# Patient Record
Sex: Female | Born: 1965 | Race: Black or African American | Hispanic: No | Marital: Married | Smoking: Never smoker
Health system: Southern US, Community
[De-identification: ages and names within clinical notes are randomized; demographics above are authoritative.]

## PROBLEM LIST (undated history)

## (undated) DIAGNOSIS — K219 Gastro-esophageal reflux disease without esophagitis: Secondary | ICD-10-CM

## (undated) DIAGNOSIS — IMO0001 Reserved for inherently not codable concepts without codable children: Secondary | ICD-10-CM

## (undated) DIAGNOSIS — D563 Thalassemia minor: Secondary | ICD-10-CM

## (undated) DIAGNOSIS — M549 Dorsalgia, unspecified: Secondary | ICD-10-CM

## (undated) DIAGNOSIS — D219 Benign neoplasm of connective and other soft tissue, unspecified: Secondary | ICD-10-CM

## (undated) DIAGNOSIS — G8929 Other chronic pain: Secondary | ICD-10-CM

## (undated) HISTORY — PX: BACK SURGERY: SHX140

## (undated) HISTORY — PX: EYE SURGERY: SHX253

## (undated) HISTORY — PX: NECK SURGERY: SHX720

## (undated) HISTORY — DX: Benign neoplasm of connective and other soft tissue, unspecified: D21.9

## (undated) HISTORY — PX: BRAIN SURGERY: SHX531

## (undated) HISTORY — PX: TUBAL LIGATION: SHX77

---

## 1998-12-31 ENCOUNTER — Other Ambulatory Visit: Admission: RE | Admit: 1998-12-31 | Discharge: 1998-12-31 | Payer: Self-pay | Admitting: Obstetrics and Gynecology

## 1999-02-11 ENCOUNTER — Encounter
Admission: RE | Admit: 1999-02-11 | Discharge: 1999-02-11 | Payer: Self-pay | Admitting: Physical Medicine & Rehabilitation

## 1999-02-11 ENCOUNTER — Encounter: Payer: Self-pay | Admitting: Physical Medicine & Rehabilitation

## 1999-03-03 ENCOUNTER — Encounter
Admission: RE | Admit: 1999-03-03 | Discharge: 1999-03-03 | Payer: Self-pay | Admitting: Physical Medicine & Rehabilitation

## 1999-03-03 ENCOUNTER — Encounter: Payer: Self-pay | Admitting: Physical Medicine & Rehabilitation

## 1999-05-10 ENCOUNTER — Ambulatory Visit (HOSPITAL_COMMUNITY)
Admission: RE | Admit: 1999-05-10 | Discharge: 1999-05-10 | Payer: Self-pay | Admitting: Physical Medicine & Rehabilitation

## 1999-05-10 ENCOUNTER — Encounter: Payer: Self-pay | Admitting: Physical Medicine & Rehabilitation

## 1999-08-11 ENCOUNTER — Ambulatory Visit (HOSPITAL_COMMUNITY)
Admission: RE | Admit: 1999-08-11 | Discharge: 1999-08-11 | Payer: Self-pay | Admitting: Physical Medicine & Rehabilitation

## 1999-08-11 ENCOUNTER — Encounter: Payer: Self-pay | Admitting: Physical Medicine & Rehabilitation

## 2000-01-26 ENCOUNTER — Other Ambulatory Visit: Admission: RE | Admit: 2000-01-26 | Discharge: 2000-01-26 | Payer: Self-pay | Admitting: *Deleted

## 2000-03-17 ENCOUNTER — Ambulatory Visit (HOSPITAL_COMMUNITY): Admission: RE | Admit: 2000-03-17 | Discharge: 2000-03-17 | Payer: Self-pay | Admitting: *Deleted

## 2000-03-17 ENCOUNTER — Encounter: Payer: Self-pay | Admitting: *Deleted

## 2000-03-26 ENCOUNTER — Inpatient Hospital Stay (HOSPITAL_COMMUNITY): Admission: AD | Admit: 2000-03-26 | Discharge: 2000-03-26 | Payer: Self-pay | Admitting: Gynecology

## 2000-05-05 ENCOUNTER — Inpatient Hospital Stay (HOSPITAL_COMMUNITY): Admission: AD | Admit: 2000-05-05 | Discharge: 2000-05-07 | Payer: Self-pay | Admitting: *Deleted

## 2000-05-07 ENCOUNTER — Encounter: Payer: Self-pay | Admitting: *Deleted

## 2000-06-02 ENCOUNTER — Encounter: Admission: RE | Admit: 2000-06-02 | Discharge: 2000-08-31 | Payer: Self-pay | Admitting: *Deleted

## 2000-06-30 ENCOUNTER — Encounter (HOSPITAL_COMMUNITY): Admission: RE | Admit: 2000-06-30 | Discharge: 2000-07-24 | Payer: Self-pay | Admitting: *Deleted

## 2000-07-23 ENCOUNTER — Encounter (INDEPENDENT_AMBULATORY_CARE_PROVIDER_SITE_OTHER): Payer: Self-pay | Admitting: Specialist

## 2000-07-23 ENCOUNTER — Inpatient Hospital Stay (HOSPITAL_COMMUNITY): Admission: AD | Admit: 2000-07-23 | Discharge: 2000-07-26 | Payer: Self-pay | Admitting: Gynecology

## 2000-10-06 ENCOUNTER — Other Ambulatory Visit: Admission: RE | Admit: 2000-10-06 | Discharge: 2000-10-06 | Payer: Self-pay | Admitting: *Deleted

## 2001-01-06 ENCOUNTER — Emergency Department (HOSPITAL_COMMUNITY): Admission: EM | Admit: 2001-01-06 | Discharge: 2001-01-06 | Payer: Self-pay | Admitting: Emergency Medicine

## 2001-07-04 ENCOUNTER — Encounter: Payer: Self-pay | Admitting: Physical Medicine & Rehabilitation

## 2001-07-04 ENCOUNTER — Encounter
Admission: RE | Admit: 2001-07-04 | Discharge: 2001-07-04 | Payer: Self-pay | Admitting: Physical Medicine & Rehabilitation

## 2001-07-09 ENCOUNTER — Encounter
Admission: RE | Admit: 2001-07-09 | Discharge: 2001-07-09 | Payer: Self-pay | Admitting: Physical Medicine & Rehabilitation

## 2001-07-09 ENCOUNTER — Encounter: Payer: Self-pay | Admitting: Physical Medicine & Rehabilitation

## 2001-07-20 ENCOUNTER — Encounter: Payer: Self-pay | Admitting: Physical Medicine & Rehabilitation

## 2001-07-20 ENCOUNTER — Encounter
Admission: RE | Admit: 2001-07-20 | Discharge: 2001-07-20 | Payer: Self-pay | Admitting: Physical Medicine & Rehabilitation

## 2001-08-08 ENCOUNTER — Encounter
Admission: RE | Admit: 2001-08-08 | Discharge: 2001-08-08 | Payer: Self-pay | Admitting: Physical Medicine & Rehabilitation

## 2001-08-08 ENCOUNTER — Encounter: Payer: Self-pay | Admitting: Physical Medicine & Rehabilitation

## 2001-09-05 ENCOUNTER — Encounter: Payer: Self-pay | Admitting: Physical Medicine & Rehabilitation

## 2001-09-05 ENCOUNTER — Encounter
Admission: RE | Admit: 2001-09-05 | Discharge: 2001-09-05 | Payer: Self-pay | Admitting: Physical Medicine & Rehabilitation

## 2001-09-09 ENCOUNTER — Emergency Department (HOSPITAL_COMMUNITY): Admission: EM | Admit: 2001-09-09 | Discharge: 2001-09-09 | Payer: Self-pay | Admitting: Emergency Medicine

## 2001-09-10 ENCOUNTER — Encounter: Payer: Self-pay | Admitting: Physical Medicine & Rehabilitation

## 2001-09-10 ENCOUNTER — Encounter
Admission: RE | Admit: 2001-09-10 | Discharge: 2001-09-10 | Payer: Self-pay | Admitting: Physical Medicine & Rehabilitation

## 2001-11-19 ENCOUNTER — Other Ambulatory Visit: Admission: RE | Admit: 2001-11-19 | Discharge: 2001-11-19 | Payer: Self-pay | Admitting: *Deleted

## 2002-03-19 ENCOUNTER — Encounter: Admission: RE | Admit: 2002-03-19 | Discharge: 2002-03-19 | Payer: Self-pay | Admitting: *Deleted

## 2002-05-08 ENCOUNTER — Inpatient Hospital Stay (HOSPITAL_COMMUNITY): Admission: AD | Admit: 2002-05-08 | Discharge: 2002-05-08 | Payer: Self-pay | Admitting: Gynecology

## 2002-05-24 ENCOUNTER — Inpatient Hospital Stay (HOSPITAL_COMMUNITY): Admission: AD | Admit: 2002-05-24 | Discharge: 2002-05-24 | Payer: Self-pay | Admitting: Gynecology

## 2002-05-27 ENCOUNTER — Encounter (INDEPENDENT_AMBULATORY_CARE_PROVIDER_SITE_OTHER): Payer: Self-pay | Admitting: Specialist

## 2002-05-27 ENCOUNTER — Inpatient Hospital Stay (HOSPITAL_COMMUNITY): Admission: AD | Admit: 2002-05-27 | Discharge: 2002-05-30 | Payer: Self-pay | Admitting: *Deleted

## 2002-08-01 ENCOUNTER — Encounter: Payer: Self-pay | Admitting: Emergency Medicine

## 2002-08-01 ENCOUNTER — Emergency Department (HOSPITAL_COMMUNITY): Admission: EM | Admit: 2002-08-01 | Discharge: 2002-08-01 | Payer: Self-pay | Admitting: Emergency Medicine

## 2002-09-25 ENCOUNTER — Encounter
Admission: RE | Admit: 2002-09-25 | Discharge: 2002-12-24 | Payer: Self-pay | Admitting: Physical Medicine & Rehabilitation

## 2003-01-22 ENCOUNTER — Encounter
Admission: RE | Admit: 2003-01-22 | Discharge: 2003-04-22 | Payer: Self-pay | Admitting: Physical Medicine & Rehabilitation

## 2003-04-21 ENCOUNTER — Encounter
Admission: RE | Admit: 2003-04-21 | Discharge: 2003-07-20 | Payer: Self-pay | Admitting: Physical Medicine & Rehabilitation

## 2003-07-21 ENCOUNTER — Encounter
Admission: RE | Admit: 2003-07-21 | Discharge: 2003-10-19 | Payer: Self-pay | Admitting: Physical Medicine & Rehabilitation

## 2003-10-21 ENCOUNTER — Encounter
Admission: RE | Admit: 2003-10-21 | Discharge: 2004-01-01 | Payer: Self-pay | Admitting: Physical Medicine & Rehabilitation

## 2004-01-01 ENCOUNTER — Encounter
Admission: RE | Admit: 2004-01-01 | Discharge: 2004-03-31 | Payer: Self-pay | Admitting: Physical Medicine & Rehabilitation

## 2004-01-02 ENCOUNTER — Ambulatory Visit: Payer: Self-pay | Admitting: Physical Medicine & Rehabilitation

## 2004-03-19 ENCOUNTER — Ambulatory Visit: Payer: Self-pay | Admitting: Physical Medicine & Rehabilitation

## 2004-04-27 ENCOUNTER — Encounter
Admission: RE | Admit: 2004-04-27 | Discharge: 2004-07-26 | Payer: Self-pay | Admitting: Physical Medicine & Rehabilitation

## 2004-06-24 ENCOUNTER — Ambulatory Visit: Payer: Self-pay | Admitting: Physical Medicine & Rehabilitation

## 2004-07-15 ENCOUNTER — Emergency Department (HOSPITAL_COMMUNITY): Admission: EM | Admit: 2004-07-15 | Discharge: 2004-07-15 | Payer: Self-pay | Admitting: Emergency Medicine

## 2004-08-10 ENCOUNTER — Other Ambulatory Visit: Admission: RE | Admit: 2004-08-10 | Discharge: 2004-08-10 | Payer: Self-pay | Admitting: Gynecology

## 2004-08-24 ENCOUNTER — Encounter
Admission: RE | Admit: 2004-08-24 | Discharge: 2004-11-22 | Payer: Self-pay | Admitting: Physical Medicine & Rehabilitation

## 2004-10-07 ENCOUNTER — Ambulatory Visit: Payer: Self-pay | Admitting: Physical Medicine & Rehabilitation

## 2004-12-09 ENCOUNTER — Encounter
Admission: RE | Admit: 2004-12-09 | Discharge: 2005-03-09 | Payer: Self-pay | Admitting: Physical Medicine & Rehabilitation

## 2004-12-09 ENCOUNTER — Ambulatory Visit: Payer: Self-pay | Admitting: Physical Medicine & Rehabilitation

## 2005-02-09 ENCOUNTER — Ambulatory Visit: Payer: Self-pay | Admitting: Physical Medicine & Rehabilitation

## 2005-04-13 ENCOUNTER — Ambulatory Visit: Payer: Self-pay | Admitting: Physical Medicine & Rehabilitation

## 2005-04-13 ENCOUNTER — Encounter
Admission: RE | Admit: 2005-04-13 | Discharge: 2005-07-12 | Payer: Self-pay | Admitting: Physical Medicine & Rehabilitation

## 2005-07-12 ENCOUNTER — Ambulatory Visit: Payer: Self-pay | Admitting: Physical Medicine & Rehabilitation

## 2005-07-12 ENCOUNTER — Encounter
Admission: RE | Admit: 2005-07-12 | Discharge: 2005-10-10 | Payer: Self-pay | Admitting: Physical Medicine & Rehabilitation

## 2005-10-10 ENCOUNTER — Encounter
Admission: RE | Admit: 2005-10-10 | Discharge: 2006-01-08 | Payer: Self-pay | Admitting: Physical Medicine & Rehabilitation

## 2005-10-10 ENCOUNTER — Ambulatory Visit: Payer: Self-pay | Admitting: Physical Medicine & Rehabilitation

## 2006-01-04 ENCOUNTER — Ambulatory Visit: Payer: Self-pay | Admitting: Physical Medicine & Rehabilitation

## 2006-02-23 ENCOUNTER — Encounter
Admission: RE | Admit: 2006-02-23 | Discharge: 2006-05-24 | Payer: Self-pay | Admitting: Physical Medicine & Rehabilitation

## 2006-02-23 ENCOUNTER — Ambulatory Visit: Payer: Self-pay | Admitting: Physical Medicine & Rehabilitation

## 2006-04-20 ENCOUNTER — Encounter
Admission: RE | Admit: 2006-04-20 | Discharge: 2006-07-19 | Payer: Self-pay | Admitting: Physical Medicine & Rehabilitation

## 2006-04-20 ENCOUNTER — Ambulatory Visit: Payer: Self-pay | Admitting: Physical Medicine & Rehabilitation

## 2006-06-19 ENCOUNTER — Ambulatory Visit: Payer: Self-pay | Admitting: Family Medicine

## 2006-06-20 ENCOUNTER — Ambulatory Visit: Payer: Self-pay | Admitting: Physical Medicine & Rehabilitation

## 2006-06-21 ENCOUNTER — Encounter: Admission: RE | Admit: 2006-06-21 | Discharge: 2006-09-19 | Payer: Self-pay | Admitting: Family Medicine

## 2006-08-14 ENCOUNTER — Encounter
Admission: RE | Admit: 2006-08-14 | Discharge: 2006-11-12 | Payer: Self-pay | Admitting: Physical Medicine & Rehabilitation

## 2006-08-16 ENCOUNTER — Ambulatory Visit: Payer: Self-pay | Admitting: Physical Medicine & Rehabilitation

## 2006-09-13 ENCOUNTER — Ambulatory Visit: Payer: Self-pay | Admitting: Family Medicine

## 2006-09-13 DIAGNOSIS — F329 Major depressive disorder, single episode, unspecified: Secondary | ICD-10-CM

## 2006-09-13 DIAGNOSIS — D509 Iron deficiency anemia, unspecified: Secondary | ICD-10-CM

## 2006-09-13 DIAGNOSIS — F3289 Other specified depressive episodes: Secondary | ICD-10-CM | POA: Insufficient documentation

## 2006-09-13 DIAGNOSIS — F411 Generalized anxiety disorder: Secondary | ICD-10-CM | POA: Insufficient documentation

## 2006-09-15 LAB — CONVERTED CEMR LAB
Basophils Relative: 0.5 % (ref 0.0–1.0)
Eosinophils Relative: 3.8 % (ref 0.0–5.0)
Hemoglobin: 11.7 g/dL — ABNORMAL LOW (ref 12.0–15.0)
Lymphocytes Relative: 34.1 % (ref 12.0–46.0)
Monocytes Relative: 12.8 % — ABNORMAL HIGH (ref 3.0–11.0)
TSH: 1.46 microintl units/mL (ref 0.35–5.50)
WBC: 6.4 10*3/uL (ref 4.5–10.5)

## 2006-09-26 ENCOUNTER — Telehealth (INDEPENDENT_AMBULATORY_CARE_PROVIDER_SITE_OTHER): Payer: Self-pay | Admitting: *Deleted

## 2006-09-27 ENCOUNTER — Ambulatory Visit: Payer: Self-pay | Admitting: Family Medicine

## 2006-09-27 DIAGNOSIS — R142 Eructation: Secondary | ICD-10-CM

## 2006-09-27 DIAGNOSIS — R141 Gas pain: Secondary | ICD-10-CM

## 2006-09-27 DIAGNOSIS — R143 Flatulence: Secondary | ICD-10-CM

## 2006-09-27 LAB — CONVERTED CEMR LAB: Beta hcg, urine, semiquantitative: NEGATIVE

## 2006-11-01 ENCOUNTER — Encounter (INDEPENDENT_AMBULATORY_CARE_PROVIDER_SITE_OTHER): Payer: Self-pay | Admitting: Family Medicine

## 2006-11-13 ENCOUNTER — Encounter
Admission: RE | Admit: 2006-11-13 | Discharge: 2007-01-09 | Payer: Self-pay | Admitting: Physical Medicine & Rehabilitation

## 2006-11-13 ENCOUNTER — Ambulatory Visit: Payer: Self-pay | Admitting: Physical Medicine & Rehabilitation

## 2006-12-07 ENCOUNTER — Ambulatory Visit: Payer: Self-pay | Admitting: Family Medicine

## 2006-12-07 DIAGNOSIS — H547 Unspecified visual loss: Secondary | ICD-10-CM

## 2006-12-13 ENCOUNTER — Ambulatory Visit: Payer: Self-pay | Admitting: Family Medicine

## 2006-12-13 LAB — CONVERTED CEMR LAB
Eosinophils Absolute: 0.2 10*3/uL (ref 0.0–0.6)
Eosinophils Relative: 3.5 % (ref 0.0–5.0)
HCT: 33.4 % — ABNORMAL LOW (ref 36.0–46.0)
Lymphocytes Relative: 35.2 % (ref 12.0–46.0)
MCV: 80.3 fL (ref 78.0–100.0)
Neutro Abs: 2.6 10*3/uL (ref 1.4–7.7)
Neutrophils Relative %: 47 % (ref 43.0–77.0)
WBC: 5.6 10*3/uL (ref 4.5–10.5)

## 2006-12-14 ENCOUNTER — Telehealth (INDEPENDENT_AMBULATORY_CARE_PROVIDER_SITE_OTHER): Payer: Self-pay | Admitting: *Deleted

## 2006-12-14 LAB — CONVERTED CEMR LAB

## 2006-12-20 ENCOUNTER — Ambulatory Visit: Payer: Self-pay | Admitting: Family Medicine

## 2006-12-21 ENCOUNTER — Encounter (INDEPENDENT_AMBULATORY_CARE_PROVIDER_SITE_OTHER): Payer: Self-pay | Admitting: *Deleted

## 2007-02-06 ENCOUNTER — Ambulatory Visit: Payer: Self-pay | Admitting: Physical Medicine & Rehabilitation

## 2007-02-07 ENCOUNTER — Encounter
Admission: RE | Admit: 2007-02-07 | Discharge: 2007-05-08 | Payer: Self-pay | Admitting: Physical Medicine & Rehabilitation

## 2007-02-23 ENCOUNTER — Encounter (INDEPENDENT_AMBULATORY_CARE_PROVIDER_SITE_OTHER): Payer: Self-pay | Admitting: Family Medicine

## 2007-03-01 ENCOUNTER — Ambulatory Visit: Payer: Self-pay | Admitting: Family Medicine

## 2007-03-01 ENCOUNTER — Telehealth (INDEPENDENT_AMBULATORY_CARE_PROVIDER_SITE_OTHER): Payer: Self-pay | Admitting: *Deleted

## 2007-03-22 ENCOUNTER — Ambulatory Visit: Payer: Self-pay | Admitting: Physical Medicine & Rehabilitation

## 2007-03-25 ENCOUNTER — Encounter (INDEPENDENT_AMBULATORY_CARE_PROVIDER_SITE_OTHER): Payer: Self-pay | Admitting: Family Medicine

## 2007-03-25 ENCOUNTER — Encounter
Admission: RE | Admit: 2007-03-25 | Discharge: 2007-03-25 | Payer: Self-pay | Admitting: Physical Medicine & Rehabilitation

## 2007-03-26 ENCOUNTER — Telehealth (INDEPENDENT_AMBULATORY_CARE_PROVIDER_SITE_OTHER): Payer: Self-pay | Admitting: *Deleted

## 2007-03-28 ENCOUNTER — Ambulatory Visit: Payer: Self-pay | Admitting: Family Medicine

## 2007-03-28 DIAGNOSIS — S83006A Unspecified dislocation of unspecified patella, initial encounter: Secondary | ICD-10-CM | POA: Insufficient documentation

## 2007-03-29 ENCOUNTER — Encounter (INDEPENDENT_AMBULATORY_CARE_PROVIDER_SITE_OTHER): Payer: Self-pay | Admitting: Family Medicine

## 2007-05-03 ENCOUNTER — Telehealth (INDEPENDENT_AMBULATORY_CARE_PROVIDER_SITE_OTHER): Payer: Self-pay | Admitting: Family Medicine

## 2007-05-09 ENCOUNTER — Telehealth (INDEPENDENT_AMBULATORY_CARE_PROVIDER_SITE_OTHER): Payer: Self-pay | Admitting: *Deleted

## 2007-05-10 ENCOUNTER — Encounter
Admission: RE | Admit: 2007-05-10 | Discharge: 2007-05-11 | Payer: Self-pay | Admitting: Physical Medicine & Rehabilitation

## 2007-05-10 ENCOUNTER — Ambulatory Visit: Payer: Self-pay | Admitting: Physical Medicine & Rehabilitation

## 2007-05-11 ENCOUNTER — Encounter (INDEPENDENT_AMBULATORY_CARE_PROVIDER_SITE_OTHER): Payer: Self-pay | Admitting: *Deleted

## 2007-05-11 ENCOUNTER — Ambulatory Visit: Payer: Self-pay | Admitting: Family Medicine

## 2007-05-21 ENCOUNTER — Telehealth (INDEPENDENT_AMBULATORY_CARE_PROVIDER_SITE_OTHER): Payer: Self-pay | Admitting: *Deleted

## 2007-05-21 ENCOUNTER — Encounter (INDEPENDENT_AMBULATORY_CARE_PROVIDER_SITE_OTHER): Payer: Self-pay | Admitting: Family Medicine

## 2007-06-06 ENCOUNTER — Telehealth (INDEPENDENT_AMBULATORY_CARE_PROVIDER_SITE_OTHER): Payer: Self-pay | Admitting: *Deleted

## 2007-07-30 ENCOUNTER — Encounter
Admission: RE | Admit: 2007-07-30 | Discharge: 2007-07-30 | Payer: Self-pay | Admitting: Physical Medicine & Rehabilitation

## 2007-07-30 ENCOUNTER — Ambulatory Visit: Payer: Self-pay | Admitting: Physical Medicine & Rehabilitation

## 2007-08-22 ENCOUNTER — Ambulatory Visit: Payer: Self-pay | Admitting: Internal Medicine

## 2007-08-22 ENCOUNTER — Encounter (INDEPENDENT_AMBULATORY_CARE_PROVIDER_SITE_OTHER): Payer: Self-pay | Admitting: *Deleted

## 2007-08-22 DIAGNOSIS — G4710 Hypersomnia, unspecified: Secondary | ICD-10-CM

## 2007-08-22 DIAGNOSIS — R635 Abnormal weight gain: Secondary | ICD-10-CM | POA: Insufficient documentation

## 2007-08-22 DIAGNOSIS — M940 Chondrocostal junction syndrome [Tietze]: Secondary | ICD-10-CM | POA: Insufficient documentation

## 2007-08-22 LAB — CONVERTED CEMR LAB: Free T4: 0.8 ng/dL (ref 0.6–1.6)

## 2007-08-28 ENCOUNTER — Encounter: Payer: Self-pay | Admitting: Internal Medicine

## 2007-08-30 ENCOUNTER — Encounter (INDEPENDENT_AMBULATORY_CARE_PROVIDER_SITE_OTHER): Payer: Self-pay | Admitting: *Deleted

## 2007-09-11 ENCOUNTER — Telehealth (INDEPENDENT_AMBULATORY_CARE_PROVIDER_SITE_OTHER): Payer: Self-pay | Admitting: *Deleted

## 2007-09-11 ENCOUNTER — Emergency Department (HOSPITAL_COMMUNITY): Admission: EM | Admit: 2007-09-11 | Discharge: 2007-09-11 | Payer: Self-pay | Admitting: Family Medicine

## 2007-09-12 ENCOUNTER — Telehealth (INDEPENDENT_AMBULATORY_CARE_PROVIDER_SITE_OTHER): Payer: Self-pay | Admitting: *Deleted

## 2007-09-27 ENCOUNTER — Ambulatory Visit: Payer: Self-pay | Admitting: Internal Medicine

## 2007-09-27 DIAGNOSIS — G43909 Migraine, unspecified, not intractable, without status migrainosus: Secondary | ICD-10-CM | POA: Insufficient documentation

## 2007-10-11 ENCOUNTER — Emergency Department (HOSPITAL_COMMUNITY): Admission: EM | Admit: 2007-10-11 | Discharge: 2007-10-11 | Payer: Self-pay | Admitting: Family Medicine

## 2007-10-17 ENCOUNTER — Emergency Department (HOSPITAL_BASED_OUTPATIENT_CLINIC_OR_DEPARTMENT_OTHER): Admission: EM | Admit: 2007-10-17 | Discharge: 2007-10-18 | Payer: Self-pay | Admitting: Emergency Medicine

## 2007-10-17 ENCOUNTER — Other Ambulatory Visit: Admission: RE | Admit: 2007-10-17 | Discharge: 2007-10-17 | Payer: Self-pay | Admitting: Obstetrics and Gynecology

## 2007-10-17 ENCOUNTER — Telehealth: Payer: Self-pay | Admitting: Internal Medicine

## 2007-10-24 ENCOUNTER — Encounter (INDEPENDENT_AMBULATORY_CARE_PROVIDER_SITE_OTHER): Payer: Self-pay | Admitting: *Deleted

## 2007-10-31 ENCOUNTER — Ambulatory Visit: Payer: Self-pay | Admitting: Hematology & Oncology

## 2007-11-02 ENCOUNTER — Encounter: Payer: Self-pay | Admitting: Internal Medicine

## 2007-11-02 ENCOUNTER — Telehealth: Payer: Self-pay | Admitting: Internal Medicine

## 2007-11-08 ENCOUNTER — Encounter
Admission: RE | Admit: 2007-11-08 | Discharge: 2007-11-09 | Payer: Self-pay | Admitting: Physical Medicine & Rehabilitation

## 2007-11-09 ENCOUNTER — Ambulatory Visit: Payer: Self-pay | Admitting: Physical Medicine & Rehabilitation

## 2008-01-11 ENCOUNTER — Telehealth (INDEPENDENT_AMBULATORY_CARE_PROVIDER_SITE_OTHER): Payer: Self-pay | Admitting: *Deleted

## 2008-01-31 ENCOUNTER — Encounter
Admission: RE | Admit: 2008-01-31 | Discharge: 2008-02-01 | Payer: Self-pay | Admitting: Physical Medicine & Rehabilitation

## 2008-02-01 ENCOUNTER — Ambulatory Visit: Payer: Self-pay | Admitting: Physical Medicine & Rehabilitation

## 2008-04-25 ENCOUNTER — Encounter
Admission: RE | Admit: 2008-04-25 | Discharge: 2008-04-25 | Payer: Self-pay | Admitting: Physical Medicine & Rehabilitation

## 2008-05-22 ENCOUNTER — Ambulatory Visit: Payer: Self-pay | Admitting: Diagnostic Radiology

## 2008-05-22 ENCOUNTER — Emergency Department (HOSPITAL_BASED_OUTPATIENT_CLINIC_OR_DEPARTMENT_OTHER): Admission: EM | Admit: 2008-05-22 | Discharge: 2008-05-22 | Payer: Self-pay | Admitting: Emergency Medicine

## 2008-06-24 ENCOUNTER — Encounter
Admission: RE | Admit: 2008-06-24 | Discharge: 2008-06-27 | Payer: Self-pay | Admitting: Physical Medicine & Rehabilitation

## 2008-06-27 ENCOUNTER — Ambulatory Visit: Payer: Self-pay | Admitting: Physical Medicine & Rehabilitation

## 2009-06-04 ENCOUNTER — Ambulatory Visit: Payer: Self-pay | Admitting: Radiology

## 2009-06-04 ENCOUNTER — Emergency Department (HOSPITAL_BASED_OUTPATIENT_CLINIC_OR_DEPARTMENT_OTHER): Admission: EM | Admit: 2009-06-04 | Discharge: 2009-06-04 | Payer: Self-pay | Admitting: Emergency Medicine

## 2009-06-11 ENCOUNTER — Emergency Department (HOSPITAL_BASED_OUTPATIENT_CLINIC_OR_DEPARTMENT_OTHER): Admission: EM | Admit: 2009-06-11 | Discharge: 2009-06-11 | Payer: Self-pay | Admitting: Emergency Medicine

## 2010-02-10 ENCOUNTER — Other Ambulatory Visit: Admission: RE | Admit: 2010-02-10 | Discharge: 2010-02-10 | Payer: Self-pay | Admitting: Obstetrics and Gynecology

## 2010-02-10 ENCOUNTER — Ambulatory Visit: Payer: Self-pay | Admitting: Obstetrics and Gynecology

## 2010-02-17 ENCOUNTER — Emergency Department (HOSPITAL_BASED_OUTPATIENT_CLINIC_OR_DEPARTMENT_OTHER): Admission: EM | Admit: 2010-02-17 | Discharge: 2010-02-18 | Payer: Self-pay | Admitting: Emergency Medicine

## 2010-02-17 ENCOUNTER — Ambulatory Visit: Payer: Self-pay | Admitting: Interventional Radiology

## 2010-02-19 ENCOUNTER — Ambulatory Visit: Payer: Self-pay | Admitting: Diagnostic Radiology

## 2010-02-19 ENCOUNTER — Emergency Department (HOSPITAL_BASED_OUTPATIENT_CLINIC_OR_DEPARTMENT_OTHER): Admission: EM | Admit: 2010-02-19 | Discharge: 2010-02-19 | Payer: Self-pay | Admitting: Emergency Medicine

## 2010-03-08 ENCOUNTER — Ambulatory Visit: Payer: Self-pay | Admitting: Obstetrics and Gynecology

## 2010-05-02 ENCOUNTER — Encounter: Payer: Self-pay | Admitting: Family Medicine

## 2010-06-22 LAB — BASIC METABOLIC PANEL
BUN: 7 mg/dL (ref 6–23)
Calcium: 8.8 mg/dL (ref 8.4–10.5)
Creatinine, Ser: 1 mg/dL (ref 0.4–1.2)
GFR calc Af Amer: 59 mL/min — ABNORMAL LOW (ref >60)
GFR calc non Af Amer: 49 mL/min — ABNORMAL LOW (ref >60)
GFR calc non Af Amer: 60 mL/min — ABNORMAL LOW (ref >60)
Glucose, Bld: 104 mg/dL — ABNORMAL HIGH (ref 70–99)
Glucose, Bld: 97 mg/dL (ref 70–99)
Sodium: 138 mEq/L (ref 135–145)

## 2010-06-22 LAB — URINALYSIS, ROUTINE W REFLEX MICROSCOPIC
Bilirubin Urine: NEGATIVE
Hgb urine dipstick: NEGATIVE
Ketones, ur: NEGATIVE mg/dL
Nitrite: NEGATIVE
Nitrite: NEGATIVE
Protein, ur: NEGATIVE mg/dL
Specific Gravity, Urine: 1.005 (ref 1.005–1.030)
Urobilinogen, UA: 0.2 mg/dL (ref 0.0–1.0)
Urobilinogen, UA: 0.2 mg/dL (ref 0.0–1.0)

## 2010-06-22 LAB — CSF CULTURE W GRAM STAIN: Culture: NO GROWTH

## 2010-06-22 LAB — CBC
Hemoglobin: 10.4 g/dL — ABNORMAL LOW (ref 12.0–15.0)
MCHC: 32.7 g/dL (ref 30.0–36.0)
MCHC: 33.3 g/dL (ref 30.0–36.0)
Platelets: 249 10*3/uL (ref 150–400)
Platelets: 300 10*3/uL (ref 150–400)
RBC: 4.3 MIL/uL (ref 3.87–5.11)
RDW: 16.3 % — ABNORMAL HIGH (ref 11.5–15.5)
WBC: 4.1 10*3/uL (ref 4.0–10.5)

## 2010-06-22 LAB — DIFFERENTIAL
Basophils Absolute: 0.1 10*3/uL (ref 0.0–0.1)
Basophils Absolute: 0.1 10*3/uL (ref 0.0–0.1)
Basophils Relative: 1 % (ref 0–1)
Basophils Relative: 2 % — ABNORMAL HIGH (ref 0–1)
Eosinophils Relative: 8 % — ABNORMAL HIGH (ref 0–5)
Lymphocytes Relative: 16 % (ref 12–46)
Monocytes Absolute: 0.6 10*3/uL (ref 0.1–1.0)
Monocytes Relative: 17 % — ABNORMAL HIGH (ref 3–12)
Neutro Abs: 2.4 10*3/uL (ref 1.7–7.7)
Neutro Abs: 2.5 10*3/uL (ref 1.7–7.7)
Neutrophils Relative %: 52 % (ref 43–77)

## 2010-06-22 LAB — GRAM STAIN

## 2010-06-22 LAB — CSF CELL COUNT WITH DIFFERENTIAL: Tube #: 4

## 2010-06-22 LAB — PREGNANCY, URINE: Preg Test, Ur: NEGATIVE

## 2010-06-22 LAB — PROTEIN, CSF: Total  Protein, CSF: 15 mg/dL (ref 15–45)

## 2010-06-30 LAB — URINE MICROSCOPIC-ADD ON

## 2010-06-30 LAB — URINALYSIS, ROUTINE W REFLEX MICROSCOPIC
Ketones, ur: NEGATIVE mg/dL
Nitrite: NEGATIVE
Protein, ur: NEGATIVE mg/dL
Urobilinogen, UA: 0.2 mg/dL (ref 0.0–1.0)

## 2010-06-30 LAB — PREGNANCY, URINE: Preg Test, Ur: NEGATIVE

## 2010-06-30 LAB — URINE CULTURE: Colony Count: 60000

## 2010-06-30 LAB — CBC
MCHC: 33.4 g/dL (ref 30.0–36.0)
RBC: 4.19 MIL/uL (ref 3.87–5.11)
WBC: 8 10*3/uL (ref 4.0–10.5)

## 2010-06-30 LAB — WET PREP, GENITAL: Trich, Wet Prep: NONE SEEN

## 2010-06-30 LAB — DIFFERENTIAL
Eosinophils Relative: 4 % (ref 0–5)
Monocytes Absolute: 0.6 10*3/uL (ref 0.1–1.0)
Monocytes Relative: 8 % (ref 3–12)
Neutrophils Relative %: 58 % (ref 43–77)

## 2010-06-30 LAB — GC/CHLAMYDIA PROBE AMP, GENITAL: GC Probe Amp, Genital: NEGATIVE

## 2010-06-30 LAB — BASIC METABOLIC PANEL
Calcium: 8.6 mg/dL (ref 8.4–10.5)
Creatinine, Ser: 0.7 mg/dL (ref 0.4–1.2)
GFR calc Af Amer: >60 mL/min (ref >60)
GFR calc non Af Amer: >60 mL/min (ref >60)

## 2010-08-24 ENCOUNTER — Emergency Department (INDEPENDENT_AMBULATORY_CARE_PROVIDER_SITE_OTHER): Payer: Medicare Other

## 2010-08-24 ENCOUNTER — Emergency Department (HOSPITAL_BASED_OUTPATIENT_CLINIC_OR_DEPARTMENT_OTHER)
Admission: EM | Admit: 2010-08-24 | Discharge: 2010-08-24 | Disposition: A | Discharge status: Home / Self Care | Payer: Medicare Other | Attending: Emergency Medicine | Admitting: Emergency Medicine

## 2010-08-24 DIAGNOSIS — R509 Fever, unspecified: Secondary | ICD-10-CM

## 2010-08-24 DIAGNOSIS — R0989 Other specified symptoms and signs involving the circulatory and respiratory systems: Secondary | ICD-10-CM

## 2010-08-24 DIAGNOSIS — G8929 Other chronic pain: Secondary | ICD-10-CM | POA: Insufficient documentation

## 2010-08-24 DIAGNOSIS — R05 Cough: Secondary | ICD-10-CM

## 2010-08-24 DIAGNOSIS — Z79899 Other long term (current) drug therapy: Secondary | ICD-10-CM | POA: Insufficient documentation

## 2010-08-24 DIAGNOSIS — B9789 Other viral agents as the cause of diseases classified elsewhere: Secondary | ICD-10-CM | POA: Insufficient documentation

## 2010-08-24 NOTE — Assessment & Plan Note (Signed)
Caitlin Stanley returns to the clinic today accompanied by her minister and his  wife. The patient has had substantially increased pain over the past  several days. She returned to [place] to pick up her mother by car  and then drove all the way back essentially with her mother alone. She  has had increased pain in her low back which is very severe at the  present time. She has started increasing her OxyContin medication to 20  mg 2 tablets twice a day instead of 1 tablet twice a day. She has  increased her immediate release use up to 6 tablets per day and had them  2-4 tablets per day. She has cut back slightly on Valium as that has  made her too sleepy. She reports that she has had severe pain over the  past several days and wonders about a change in medications. We have  discussed that in the office today.   The patient reports that she has talked to her case manager through  Medicare and disability. Her Medicare advisor has lead her believe that  if she got a slip from myself regarding help into the home that that  could be provided.   MEDICATIONS:  1. OxyContin immediate release 5 mg 1-2 tablets p.o. b.i.d. p.r.n.      (recently 4-6 day).  2. Oxycodone sustained release 20 mg q.12 h (recently 40 mg b.i.d.).  3. Valium 10 mg t.i.d. p.r.n.  4. Prozac 20 mg daily.   REVIEW OF SYSTEMS:  Noncontributory.   PHYSICAL EXAMINATION:  Reasonably ill-appearing adult middle-age female  who is lying on the exam table in moderate to severe discomfort. Her  friends were with her and brought her in by car today. The patient has  trigger points identified in her trapezii, thoracic, lower lumbar and  bilateral SI joints. She does report severe pain even with minimal  manual muscle testing.   IMPRESSION:  1. Lumbar spondylosis with annular disk bulge without disk herniation.  2. 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  3. 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   In the office today, we did agree to increase her OxyContin sustained  release to 40 mg 1 tablet q.12 h. I have asked her to keep her immediate  release down to a maximum of 4 tablets a day and I refilled that in the  office today. I also refilled her Prozac. She does not need a refill on  her Valium in the office at this point. I have given them a slip for an  aide to be into the house to help with childcare and chores and  hopefully this can be obtained for her. I will anticipate that she would  have gradual improvement in her low back pain as probably a lot of the  pain that she is experiencing right now is secondary to spasms with  recent increased driving.   We did have the patient sign a consent for trigger point injections. I  have prepared a 10 mL syringe with 9 mL of 1% lidocaine and 1 mL of  Kenalog-40. I cleansed 2 areas in her trapezii, 2 areas in her mid  thoracic, 2 areas in her lumbar and 2 areas in her buttocks. Each area  on each side of the box was injected with approximately 1.5 mL of the  above-noted mixture and the patient tolerated the procedure well. Good  hemostasis was obtained at each injection site.  Will plan on seeing the patient in followup in approximately 2 months'  time with refills prior to that appointment as necessary.           ______________________________  Ellwood Dense, M.D.     DC/MedQ  D:  02/07/2007 11:32:46  T:  02/07/2007 16:26:49  Job #:  [id number]

## 2010-08-24 NOTE — Assessment & Plan Note (Signed)
Caitlin Stanley returns to clinic today for followup evaluation.  She reports  that she was at a political rally recently in high heels for an extended  period of time.  She has had increased severe spasms of her back since  that time.  The spasms are from her buttocks up through her cervical  spine.  She requests injections that she has had previously along with  an adjustment in her Valium.  She has been using the Valium twice a day,  but wants to try to use it 3 times a day to help with the spasms.  She  also needs refills on her Prozac and oxycodone medications.   REVIEW OF SYSTEMS:  Noncontributory.   MEDICATIONS:  1. Oxycodone immediate release 5 mg 1 to 2 tablets p.o. b.i.d. p.r.n.      (2 to 4 per day).  2. OxyContin sustained release 20 mg q. 12 hours.  3. Valium 10 mg b.i.d. p.r.n.  4. Prozac 20 mg daily.   PHYSICAL EXAMINATION:  Ill-appearing, middle aged adult female in  moderate to severe acute discomfort.  Blood pressure and vitals were not obtained.  She has trigger points identified in her bilateral thoracic region along  with buttocks bilaterally superiorly.   IMPRESSION:  1. Lumbar spondylosis with anular disk bulge without disk herniation.  2. 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  3. Chronic low back pain with mid thoracic/buttock/trapezii trigger      points.   In the office today, we did refill the patient's Valium, and increased  her up to 3 times a day as needed.  We also refilled the Prozac  medication.  The oxycodone sustained released and immediate release were  filled as of Aug 23, 2006.  She also requested hand control brakes on a  walker so that she can get around.  She is having some troubles with the  increased back pain.  I have asked her to try heat on the muscles to  help with the spasms in addition to the medication.  We also had the  patient timed for trigger point injections.  I prepared a 10-cc syringe  with 8 cc of 1% lidocaine, and 1  cc of Kenalog 40.  I cleansed 2 areas  of her trapezii, 2 areas of her thoracic spine, and 2 areas around her  sacroiliac joints.  Each of these were cleansed with alcohol, and then  injected with approximately 1.5 cc to 2 cc of the above noted mixture.  The patient tolerated the procedure well.  Good hemostasis was obtained  at each site.   We will plan on seeing the patient in followup in this office in  approximately 1 to 2 months' time with refills prior to that appointment  as necessary.           ______________________________  Ellwood Dense, M.D.     DC/MedQ  D:  08/16/2006 11:27:22  T:  08/16/2006 12:52:44  Job #:  [id number]

## 2010-08-24 NOTE — Assessment & Plan Note (Signed)
Caitlin Stanley returns to clinic today for followup evaluation.  She missed  her appointment scheduled for July 05, 2007.  She does report that she  needs trigger point injections in the office today.  She also needs  refills on several of her medicines.   MEDICATIONS:  1. OxyContin sustained release 40 mg every 12 hours.  2. Percocet 5/325 1 to 2 tablets p.o. t.i.d. p.r.n. (4 to 6 per day).  3. Valium 10 mg t.i.d. p.r.n.  4. Prozac 20 mg daily.   REVIEW OF SYSTEMS:  Noncontributory.   PHYSICAL EXAM:  Middle-aged adult female in mild to moderate acute  discomfort.  Blood pressure is 142/72 with a pulse of 58, respiratory  rate 18, and O2 saturation 97% on room air.  She has 4+/5 strength  throughout the bilateral upper and lower extremities.  She complains of  trigger points in her trapezius muscles bilaterally, along with her mid  posterior thoracic musculature and 2 areas in her gluteus maximus  bilaterally.   IMPRESSION:  1. Lumbar spondylosis with anular disk bulge without disk herniation.  2. 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  3. Chronic low back and mid thoracic/buttocks/trapezius trigger      points.  4. Recent right knee dislocation.  5. Status post fall with contusion of the left side.   In the office today, the patient does ask about bracing for her left  knee.  That would be double upright knee brace which would not fit under  her clothes and would be rather restrictive.  She already has been using  Neoprene sleeves and I have encouraged her to use those for the time  being.   In terms of pain medicines, we refilled each of her medicines in the  office today.  Each was dated for August 04, 2007.   Patient signed a consent for trigger point injections.  I prepared a 10-  mL syringe with 8 mL of 1% lidocaine and 1 mL of Kenalog 40.  The 2  areas of her trapezius, one in her mid thoracic region posteriorly and  knee area and her bilateral SI joint in the  gluteus maximus were each  injected with approximately 1.5 to 2 mL of the above-noted mixture.  The  patient had good hemostasis at each site and tolerated the procedure  well.   We will plan on seeing the patient in followup in approximately 3  months' time with refills prior to that appointment if necessary.           ______________________________  Ellwood Dense, M.D.     DC/MedQ  D:  07/30/2007 10:29:53  T:  07/30/2007 10:40:43  Job #:  [id number]

## 2010-08-24 NOTE — Assessment & Plan Note (Signed)
REASON FOR VISIT:  Lumbar spondylolisthesis, chronic back pain.   Caitlin Stanley returns today.  She has a history of lumbar spondylosis with  her last MRI in 2003.  She states that she fell on May 22, 2008,  and hurt her back as well as her hip.  She went to the [facility] ED  that is in [facility] where she was seen by Dr. Lynelle Doctor, had an x-ray of  her hip and pelvis, which looked fine.  Her knee x-ray was fine.  She  was told to follow up with [facility].  She was  given Flexeril and Lodine, but no narcotic analgesics.  She has been  under pain contract with this office.  She followed up with her primary  care physician, Dr. Leonette Most, who sent her to the neurologist, Dr. Epimenio Foot.  She reportedly had a repeat MRI and was reportedly set up for an  epidural steroid injection.   She states that her average pain is 7-8/10 mainly in the back, left-  sided buttock and some in the knees as well.  Described as constant,  aching pain.  She is running low on her medications due to 2 missed  appointments and states that her pain level is worse than usual because  of that.   REVIEW OF SYSTEMS:  Positive weakness, trouble walking, spasms, but  negative for bowel or bladder dysfunction.   PHYSICAL EXAMINATION:  Her blood pressure is 113/71, pulse 87,  respirations 18, O2 sat is 99% on room air.  Overweight female in no  acute distress.  Orientation x3.  Affect is initially tired-appearing,  but then becoming anxious.  He ambulates without assistive device in a  forward flexed position, but otherwise she will ambulate with crutches.  Her extremities are without edema.  Coordination is normal in the upper  and lower extremities.  Deep tendon reflexes normal in the upper and  lower extremities.  Sensation normal in the upper and lower extremities.   Her back has moderate diffuse tenderness with light palpation.  Her  lower extremity strength is normal, range of motion normal.  No  evidence  of effusion at the knees.  No evidence of erythema in the knees or ankle  or feet.   IMPRESSION:  History of chronic lumbar pain.  Last MRI that we have on  record was 2003, demonstrating shallow protrusion at L4-L5 level.  She  did have surgery, hemilaminotomy at that level in the past.  I did note  a scar in that area.  She has had some good results x2 with epidural  injection L5-S1 done at [facility].   IMAGING STUDIES:  Her third epidural, she ran into a dural puncture and  had post-dural headaches necessitating blood patch.   I discussed the patient's treatment plan.  She is currently on  OxyContin, which I generally do not use in my patients.  She is on  oxycodone as well.  She is also on Valium.  The neurologist, Dr. Epimenio Foot  put her on gabapentin, which I feel was a good choice.  I indicated him  that I would change her OxyContin to MS Contin 30 mg b.i.d. and her  Percocet immediate release to morphine sulfate immediate release 15  t.i.d.   I did indicate to her that we do epidurals here.  I have recommended  that she make a choice either to be treated here for all of her pain and  procedures or go to Dr. Epimenio Foot,  which would be actually closer to her  home.  She states she would think about it.  We did check the urine drug  screen today.  Dr. Thomasena Edis had not performed one on her in the past at  least within the last couple years.   Discussed with the patient, agrees with the plan.  Prozac 20 mg also  prescribed today, and we also reviewed a [place] Controlled  Substance Prescribers report and this showed all of her controlled  substances were prescribed by Dr. Thomasena Edis.      Erick Colace, M.D.  Electronically Signed     AEK/MedQ  D:  06/27/2008 16:44:56  T:  06/28/2008 04:58:22  Job #:  [id number]   cc:   Despina Arias  Fax: [phone]   Loyal Jacobson, MD

## 2010-08-24 NOTE — Assessment & Plan Note (Signed)
Caitlin Stanley returns to the clinic today for followup evaluation ahead of  schedule.  She reports that she recently was playing a game with her  children and caught her foot on the carpet when she was wearing Croqs.  She reports that she dislocated her right knee and initially went to the  emergency room and had some x-rays done, which showed no fracture.  She  was told by the emergency room physician that she probably should have  an MRI scan of her knee, but nothing was set up for her.  She was placed  in a long-leg immobilizer, and told to do no weightbearing and told to  use crutches for ambulation.  She comes in to the office today for  evaluation and scheduling of an MRI scan.   MEDICATIONS:  1. OxyContin immediate release 5 mg tablets 1 to 2 p.o. b.i.d. (4 to 6      per day).  2. OxyContin sustained release 40 mg q.12h.  3. Valium 10 mg t.i.d. p.r.n.  4. Prozac 20 mg daily.   REVIEW OF SYSTEMS:  Noncontributory.   PHYSICAL EXAM:  Middle-aged adult female lying on the exam table with  knee immobilizer on her right lower extremity.  She has a blood pressure of 130/76, pulse 97, respiratory rate 18, and  O2 saturation 98% on room air.  She has some mild swelling with some pain over the medial aspect of her  right knee.  We did not attempt strength testing or range of motion with  her recent dislocation reported.   IMPRESSION:  1. Lumbar spondylosis with annular disk bulge without disk herniation.  2. 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  3. Chronic low back and mid thoracic/buttock/trapezius trigger points.  4. Recent right knee dislocation.   In the office today, we did set the patient up for an MRI scan of her  right knee.  We also refilled her OxyContin sustained release, valium,  and oxycodone immediate release in the office today as of the end of the  month.  The patient was complaining of trigger points in her usual  areas, including the bilateral trapezii, mid  thoracic, and buttock  regions.  After signing consent, we prepared a 5 mL syringe with 1 mL of  Kenalog and 4 mL of 1% lidocaine.  Each area was cleansed with an  alcohol pad and then injected with approximately 1 mL of the above noted  mixture.  The patient tolerated the procedure well and good hemostasis  was obtained at each of the 5 sites.   We will plan on seeing the patient in followup in this office in  approximately 2 to 3 months' time with refills prior to that appointment  as necessary.  We will review the MRI scan with her when it becomes  available.           ______________________________  Ellwood Dense, M.D.     DC/MedQ  D:  03/23/2007 09:44:52  T:  03/23/2007 15:11:53  Job #:  [id number]

## 2010-08-24 NOTE — Assessment & Plan Note (Signed)
HISTORY OF PRESENT ILLNESS:  Caitlin Stanley returns to clinic today for  followup evaluation.  She reports that, overall, she is doing well on  her OxyContin and oxycodone immediate release.  She does complain of  some increased swelling of her left knee when she hurt that several  weeks ago.  She does wear brace occasionally on her left knee.  She  wonders about trigger point injections of her left knee along with her  upper and lower back.  She also requests referral to surgeons for lap  band surgery.  She has gained some weight and then understands that she  is not the typical candidate for lap band surgery, but she feels that  her increased weight that she has experienced has worsened her back pain  and knee pain.   MEDICATIONS:  1. OxyContin sustained release 40 mg q.12h.  2. Percocet 5/325 mg 1-2 tablets p.o. t.i.d. p.r.n. (4 per day).  3. Valium 10 mg t.i.d. p.r.n.  4. Prozac 20 mg daily.   REVIEW OF SYSTEMS:  Positive for weight gain and limb swelling.   PHYSICAL EXAMINATION:  GENERAL:  Middle-aged adult female in mild-to-  moderate acute discomfort.  VITAL SIGNS:  Blood pressure 162/96 with pulse of 79, respiratory rate  24, and O2 saturation 97% on room air.  MUSCULOSKELETAL:  She complains of trigger points of her trapezius  muscles, mid posterior thoracic, SI joints, and left knee.   IMPRESSION:  1. Lumbar spondylosis with annular disk bulge without disk herniation.  2. Anterior wedge deformity of thoracic spine.  3. Chronic low-back and mid thoracic/buttock/trapezius trigger points.  4. Recent right knee dislocation.  5. Status post fall with contusion of the left knee.   In the office today, we did refill the patient's Valium along with her  Prozac, OxyContin, and oxycodone medication.  We actually had switched  her from Percocet back to oxycodone if she feels she is able to obtain  that from her local pharmacy.   In terms of trigger points, we had her sign a consent  for trigger point.  I prepared 10 mL syringe with 1 mL of Kenalog 40 and 9 mL of lidocaine  1%.  I cleansed 2 areas in her trapezius, 2 areas in her mid thoracic  region, and 2 areas in the bilateral SI joint.  Each of these were  injected with approximately 1-1.5 mL of the above noted mixture after  cleaning with an alcohol pad.  Good hemostasis was obtained at each  site.  I then injected the remaining 1.5 mL into her medial knee on the  left side.  The patient tolerated the procedure and hemostasis was  obtained at each site.   PLAN:  We will plan to see the patient in followup in this office in  approximately 2 months' time with followup refills as necessary prior to  that appointment.           ______________________________  Ellwood Dense, M.D.     DC/MedQ  D:  11/09/2007 16:31:23  T:  11/10/2007 07:30:47  Job #:  [id number]

## 2010-08-24 NOTE — Assessment & Plan Note (Signed)
Caitlin Stanley returned to clinic today for followup evaluation.  During the  last clinic visit, we had made a referral to [facility] for lap-band surgery.  She was apparently given paper work and  was mailed a series of questionnaires.  She actually had an appointment,  but decided that she wanted to try to lose weight on her own with  activity and gym work before she would proceed with surgery.  She did  not keep the appointment for the surgical evaluation.   Instead the patient started using a personal trainer at a local gym.  She reports that when she was lifting some minor weights several days  ago, she experienced severe headache pain posteriorly in her neck.  She  complains of persistent severe pain throughout her neck bilaterally in  the back.  She actually went to the emergency room and had a head CT  done which showed no acute abnormality.  She then was seen by Dr. Leonette Most  reportedly, a local internist.  They apparently did x-rays of her  cervical region which apparently showed some increased movement or  increased joint space with flexion and extension views.  No CAT scan was  actually completed, and she was started in a neck brace for  approximately 1 week's time.  She does not feel that she can continue  with a physical trainer at the gym at present, went back to get a refund  if at all possible.   The patient would like to have trigger point injection in the office  today along with refills of her pain medicines.   MEDICATIONS:  1. OxyContin sustained release 40 mg q.12 h.  2. Percocet 5/325 1-2 tablets p.o. t.i.d. p.r.n. (6 per day).  3. Valium 10 mg t.i.d. p.r.n.  4. Prozac 20 mg daily.   REVIEW OF SYSTEMS:  Noncontributory.   PHYSICAL EXAMINATION:  GENERAL:  Middle-aged, adult female in mild-to-  moderate acute discomfort.  VITAL SIGNS:  Blood pressure 120/61 with pulse of 87, respiratory rate  18, and O2 saturation 99% on room air.  She complains of  trigger points  of her cervical spine posteriorly along with her mid thoracic and SI  joints bilaterally.   IMPRESSION:  1. Lumbar spondylosis with annular disk bulge without disk herniation.  2. 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  3. Chronic low back and mid thoracic/buttock/trapezius trigger points.  4. Recent right knee dislocation.  5. Status post fall with contusion of the left knee.   In the office today, we did refill the patient's oxycodone immediate  release along with her Valium and OxyContin.  We also gave her a slip to  try to get her released from the contract that she has with the gym  group.  We also had her sign and consent for trigger point injections.  I prepared 10 mL syringe with 8 mL of 1% lidocaine and 1 mL of Kenalog  40.  I injected a total of 7 sites; 2 in her left trapezius, 1 in her  right trapezius, 2 in her mid thoracic region, and 1 in each SI joint  region.  The patient tolerated the procedure well and good hemostasis  was obtained at each site.  Approximately, 1-1.5 mL of the above noted  mixture was injected at each site.   We will plan on seeing the patient in followup in this office in  approximately 3 months' time with refills prior to that appointment as  necessary.  We also will make a repeat referral to [facility] for  possible lap-band surgery.           ______________________________  Ellwood Dense, M.D.     DC/MedQ  D:  02/01/2008 13:05:12  T:  02/02/2008 01:41:41  Job #:  [id number]

## 2010-08-24 NOTE — Assessment & Plan Note (Signed)
Ms. Lovin returns to the clinic today for increased acute pain after a  recent fall.  She reports she was at [employer] early this  week, and she was opening a door and turning up a ramp, and she fell on  a mat just inside the door.  She reports that she landed on her left  side and had extreme pain on her left side.  She was taken by ambulance  to [facility] where x-rays of her left side were all  negative, and she also had a CT scan which was negative for acute  abnormalities.  She was diagnosed with contusions.  She did see her  primary care physician this morning at his office and was referred here  for further care.  She is unable to take anti-inflammatory medications  secondary to use of her Prozac.  She does have lidocaine patches in  place, probably given to her by her primary care physician today.  There  is also a suggestion about possible use of Flexeril for her pain.   MEDICATIONS:  1. OxyContin sustained release 40 mg q.12 h.  2. Oxycodone immediate release 5 mg 1-2 tablets p.o. b.i.d. p.r.n. (4      to 6 per day).  3. Valium 10 mg t.i.d. p.r.n.  4. Prozac 20 mg daily.  5. Lidocaine patch 5% (started today).   REVIEW OF SYSTEMS:  Noncontributory.   PHYSICAL EXAMINATION:  A middle-aged adult female in moderate-to-severe  acute pain involving her entire left side.  Vitals were not obtained in the office today.  She has lidocaine patch present on her left posterior thoracic region.  She complains of pain with manual muscle testing attempts and range of  motion of her entire left arm and leg.   IMPRESSION:  1. Lumbar spondylosis with annular disk bulge without disk herniation.  2. 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  3. Chronic low back and mid thoracic/buttock/trapezius trigger points.  4. Recent right knee dislocation.  5. Status post recent fall with contusions of the left side.   In the office today we did refill the  patient's Valium along with her  OxyContin and Prozac.  In place of her oxycodone immediate release we  have started her on oxycodone 5/325 to be used 1 to 2 tablets p.o.  t.i.d. p.r.n.  This will be essentially the same as the oxycodone that  she was taking previously with the addition of the Tylenol to hopefully  give her increased benefit.  She is also prescribed lidocaine patches  and samples of lidocaine patches and Flexeril 10 mg to be used 1/2 to 1  tablet t.i.d. p.r.n.  Will plan on seeing her in followup in  approximately 2 months' time.  She is still considering withdrawing from  the classes that she is taking at present as she does not feel she will  be able to return to her classes as of Monday.  There is a good  possibility that she may be incapacitated for possibly 10 to 14 days,  and that may put her too far behind to catch up this semester.  She will  consider withdrawing from the classes, according to her, over the next  few days.  We will plan on seeing her in followup as noted above.           ______________________________  Ellwood Dense, M.D.     DC/MedQ  D:  05/11/2007 13:17:31  T:  05/11/2007 15:25:01  Job #:  [id number]

## 2010-08-24 NOTE — Assessment & Plan Note (Signed)
Caitlin Stanley returns to clinic today for followup evaluation.  She reports  that she is doing fairly well, overall.  She does request trigger point  injections in the office today.  She also complains of her left knee  feeling unstable, where she has had prior patellar dislocations.  She  does need a refill on her medicines in the office today.  She did not  return to [place] for her normal vacation, as she is still trying to sell  her mother's home in [place].   REVIEW OF SYSTEMS:  Noncontributory.   MEDICATIONS:  1. OxyContin immediate release 5 mg one to two tablets p.o. b.i.d.      p.r.n. (2-4 per day).  2. OxyContin sustained release 20 mg q. 12 hours.  3. Valium 10 mg t.i.d. p.r.n.  4. Prozac 20 mg daily.   PHYSICAL EXAM:  Reasonably well-appearing, middle-aged, adult female, in  mild to no acute discomfort.  Blood pressure is 109/67 with a pulse of 56, respiratory rate is 18 and  O2 saturation 100% on room air.  She has trigger points identified in her bilateral trapezius, mid-  thoracic region, lower lumbar region and bilateral SI joints.   IMPRESSION:  1. Lumbar spondylosis with annular disc bulge without disc herniation.  2. 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  3. Chronic low back pain with mid-thoracic/buttock/trapezius trigger      points.   In the office today, we did refill the patient's Valium, along with her  oxycodone and oxycodone sustained release.  We also gave her a script  for a neoprene sleeve, which can be used on the left knee to help her  with patellar pain.  We will have her sign a trigger point injection  consent.  I have prepared a 10 mL syringe with 8 mL of 1% lidocaine and  1 mL of Kenalog 40.  I plan a total of six injection sites, two in her  trapezius, one in the mid-thoracic, one in the lower lumbar and two in  the SI joints bilaterally.  Each area was injected with approximately 1  to 1.5 mL of the above-noted mixture and the  patient tolerated the  procedure well.  Good hemostasis was obtained at each of the six sites.   We will plan on seeing the patient in followup in approximately two to  three months' time with refills of medication prior to that appointment  as necessary.           ______________________________  Ellwood Dense, M.D.     DC/MedQ  D:  11/15/2006 12:29:10  T:  11/15/2006 15:29:54  Job #:  [id number]

## 2010-08-27 NOTE — Assessment & Plan Note (Signed)
REASON FOR FOLLOW-UP:  Caitlin Stanley returns to clinic today for follow-up  evaluation.  She has experienced laryngitis and a  cold over the past one to  one and one-half weeks.  She does report that she is experiencing more  spasms of her back especially at night when she tries to get some rest.  She  is asking for some medication for that problem.  She continues to take care  of her three small children in addition to managing her own life.   MEDICATIONS:  1. Oxycodone 5 mg 1-2 tablets p.o. b.i.d. p.r.n.  2. Effexor 75 mg daily.   PHYSICAL EXAMINATION:  Well-appearing adult female in mild acute distress.  She has strength of 5/5 throughout the bilateral upper extremities.  Bilateral lower extremity exam showed 4 plus + to five minus over five  strength throughout.  Bulk and tone normal and reflexes were 2+ and  symmetrical.   IMPRESSION:  1. Lumbar spondylosis with annular disk bulge without herniation per     magnetic resonance imaging  study July 09, 2001.  2. Minimal anterior wedge deformity at thoracic vertebrae-5 vertebral body     per thoracic study July 04, 2001.  3. Chronic low back pain.  4. Mid-thoracic/buttock trigger points.   DISPOSITION:  In the office today we did refill the patient's Oxycodone and  actually gave her a new prescription for Baclofen 10 mg to be used one  tablet p.o. q.h.s.  She did request trigger point injections and I performed  two, one in her mid interscapular region and another in her right upper  buttock region.  She had signed the consent form for these procedures and  was chaperoned in the office during the procedure.  Each site was injected  with approximately 1.5 to 2 cc of 1% lidocaine mixed with 1 cc of Kenalog  40.  The patient tolerated the procedure well and good hemostasis was  obtained.   PLAN:  I plan on seeing the patient on follow-up in this office in  approximately three month's time.      Ellwood Dense, M.D.   DC/MedQ  D:  04/23/2003 15:54:11  T:  04/23/2003 22:39:16  Job #:  [id number]

## 2010-08-27 NOTE — Assessment & Plan Note (Signed)
FOLLOW UP:  Ms. Golberg returns to the clinic today for follow-up evaluation.  During the last clinic visit, we did do trigger point injections into her  mid-intrascapular region and right upper buttock region.  She reports that  she regained relief for approximately two to two and a half months.  She  would like further injections in the office today in a couple of different  spots.   The patient reports that she is concerned about her mood at the present  time.  She has entered an anniversary where she has thoughts about her  brother's suicide.  Apparently, she was very close to him and had spoken  with him prior to the suicide and homicide that occurred a few years ago.  She is concerned that the Effexor 75 mg q.d. is not giving her adequate  improvement in her mood.  She reports being frustrated, along with sad and  angry especially with the kids at home and she does not like feeling that  way.   The patient reports that her oxycodone medication is giving her reasonable  relief at the present time.   MEDICATIONS:  1. Oxycodone 5 mg 1-2 tablets p.o. b.i.d. p.r.n.  2. Effexor XR 75 mg q.d.   PHYSICAL EXAMINATION:  GENERAL:  A well-appearing, slightly overweight adult  female with a depressed affect.  VITAL SIGNS:  Blood pressure was 113/74 with a pulse of 91 and O2 saturation  of 100% on room air.  NEUROLOGICAL:  The patient has 5-/5 strength throughout the bilateral upper  and lower extremities.  Bulk and tone were normal.  Reflexes were 2+ and  symmetrical.   IMPRESSION:  1. Lumbar spondylosis with anular disc bulge without herniation per mitral     regurgitation study March 2003.  2. Minimal anterior wedge deformity thoracic vertebrae-5.  3. Chronic low back pain.  4. Midthoracic/buttock trigger point.   RECOMMENDATIONS:  In the clinic today, I did refill her oxycodone 5 mg to be  used 1-2 p.o. b.i.d.  I have also refilled her Effexor but increased the  dose to 150 mg q.d.   We have made a referral to Dr. Gladstone Pih for  outpatient psychological evaluation.  The patient did request the trigger  point injections, specifically into her left trapezius region and one into  her left midthoracic region and another into her right buttock region.  Those areas were identified.  The patient signed consent for the trigger  point injections.  She was chaperoned into the office by the LPN.  The areas  were cleansed with Betadine and an alcohol pad.  Each area was injected with  approximately 2 cc of 1% lidocaine mixed with 0.5 cc of Kenalog 40.  The  patient tolerated the procedure well and good hemostasis was obtained.  Band-  aids were placed over each of the three injections sites.   I will plan on seeing the patient in follow-up in approximately three months  time with refill of medications prior to that appointment.      Ellwood Dense, M.D.   DC/MedQ  D:  07/23/2003 16:29:38  T:  07/23/2003 23:34:18  Job #:  [id number]

## 2010-08-27 NOTE — Assessment & Plan Note (Signed)
HISTORY OF PRESENT ILLNESS:  Caitlin Stanley returns to the clinic today for  follow up evaluation.  She was last here February 10, 2005.  She reports that  she got several weeks of relief with the injections done previously in the  office using Lidocaine and Kenalog.  The patient is requesting repeat  trigger point injections in the office today.  She gets reasonable relief  using her OxyContin CR 20 mg q.12 h and immediate release Oxycodone used  approximately 2-3 tablets per day.  She does need refills on those over the  next day or so.   MEDICATIONS:  1.  Oxycodone Immediate Release 5 mg 1-2 tablets p.o. b.i.d. p.r.n. (2-3 per      day).  2.  OxyContin CR 20 mg q.12 h.  3.  Wellbutrin two tablets daily.   REVIEW OF SYSTEMS:  Noncontributory.   PHYSICAL EXAMINATION:  GENERAL APPEARANCE:  Well-appearing, moderately  overweight adult female in mild to moderate acute discomfort.  VITAL SIGNS:  Blood pressure 109/71, pulse 68, respirations 16, O2  saturation 100% on room air.  She complains of trigger points in her left  trapezius, mid back and especially in her left buttock region along with one  spot in her right buttock.  She requests trigger point injections on each of  these sites.   IMPRESSION:  1.  Lumbar spondylosis with annular disk bulge without disk herniation.  2.  Anterior wedge deformity of thoracic spine.  3.  Chronic low back pain.  4.  Mid thoracic/buttock trigger points.   PLAN:  In the office today, we did refill the patient's oxycodone and  OxyContin medications as of January 5.  They also had her sign a consent for  trigger point injections.  I prepared a 10 cc syringe with 9 cc of 1%  lidocaine along with 1 cc of Kenalog 40.  I identified approximately 7  trigger points, one in her left trapezius, one in her rhomboids on the left  and four in her left buttock and one in her right buttock.  Each of these  areas was cleansed with an alcohol pad and then injected with  approximately  1 to 1.5 cc of the Lidocaine/Kenalog mixture.  The patient tolerated the  procedure well, and good hemostasis was obtained to each site.   We will plan on seeing the patient in follow up in this office in  approximately 2 to 3 months time with refill of medication prior to that  appointment as necessary.           ______________________________  Ellwood Dense, M.D.     DC/MedQ  D:  04/14/2005 10:16:49  T:  04/14/2005 10:46:06  Job #:  [id number]

## 2010-08-27 NOTE — Assessment & Plan Note (Signed)
Ms. Carbin returns to the clinic today for follow-up evaluation.  She does  need refills on each of her medicines including her oxycodone immediate-  release, OxyContin, and Valium medications.  She continues to get reasonable  relief.  She does need trigger point injections in the office today as she  usually does.   MEDICATIONS:  1.  Oxycodone immediate-release 5 mg one to two tablets p.o. b.i.d. p.r.n.      (two to three per day).  2.  OxyContin CR 20 mg q.12h.  3.  Valium 10 mg b.i.d. p.r.n.   REVIEW OF SYSTEMS:  Noncontributory.   PHYSICAL EXAMINATION:  GENERAL:  Reasonably well-appearing, moderately  overweight adult female in mild to moderate acute discomfort.  VITAL SIGNS:  Blood pressure 129/67 with a pulse of 76, respiratory rate 16,  and O2 saturation 99% on room air.  MUSCULOSKELETAL:  She identifies trigger points in her left trapezius, mid  thoracic region, and one in her upper buttocks bilaterally.  She requested  trigger point injections at these four sites.   IMPRESSION:  1.  Lumbar spondylosis with anular disk bulge without disk herniation.  2.  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  3.  Chronic low back pain.  4.  Mid thoracic/buttock trigger points.   In the office today we refilled the patient's OxyContin sustained-release,  oxycodone immediate-release, and Valium.  Also had her sign a consent for  four trigger point injections.  I prepared a 5 mL syringe with 4 mL of 1%  lidocaine and 1 mL of Kenalog 40.  I cleansed the areas with an alcohol pad  and then injected each with approximately 1.5 mL of the above-noted mixture.  The patient tolerated the procedure well.  Good hemostasis was obtained at  each of the four sites.   Will plan on seeing her on follow-up in approximately two to three months'  time.           ______________________________  Ellwood Dense, M.D.     DC/MedQ  D:  07/13/2005 14:04:07  T:  07/14/2005 12:52:50  Job #:   [id number]

## 2010-08-27 NOTE — H&P (Signed)
   NAME:  Royalti, Schauf NO.:  [id number]   MEDICAL RECORD NO.:  [id number]                   PATIENT TYPE:   LOCATION:                                       FACILITY:   PHYSICIAN:  Katy Fitch, M.D.               DATE OF BIRTH:   DATE OF ADMISSION:  05/27/2002  DATE OF DISCHARGE:                                HISTORY & PHYSICAL   CHIEF COMPLAINT:  Elective repeat cesarean section.   HISTORY OF PRESENT ILLNESS:  The patient is a 45 year-old Gravida III, Para  2 at 39 weeks who is scheduled for elective repeat cesarean section on  February 16.  The patient had a previous cesarean section for a twin  gestation.  The patient underwent amniocentesis and noted to have a 46XY  chromosomal pattern due to [facility].  The patient is also desiring to have her  tubes tied for this pregnancy.  She has had no other current problems.   PAST MEDICAL HISTORY:  None.   PAST SURGICAL HISTORY:  None.   MEDICATIONS:  Prenatal vitamins.   ALLERGIES:  None.   SOCIAL HISTORY:  She denies any tobacco, alcohol or drugs.   FAMILY HISTORY:  No mental retardation or epithelial cancers.   PHYSICAL EXAMINATION:  VITAL SIGNS: Blood pressure 112/70.  HEENT:  Throat is clear.  LUNGS:  Clear to auscultation bilaterally.  HEART:  Regular rate and rhythm.  ABDOMEN: Gravid and non-tender.  Estimated fetal weight is 7 pounds 8  ounces.  EXTREMITIES:  Without any cyanosis, clubbing, edema or tenderness.  PELVIC EXAM:  Closed, thick and high.   ASSESSMENT AND PLAN:  45 year-old Gravida III, Para 2 at 39 weeks  for elective repeat cesarean section and tubal sterilization.  The patient  was extensively counseled on the risks of the procedure including bleeding,  transfusion risks, hepatitis or HIV infection, scar tissue, wound breakdown,  potential damage to internal organs needing further surgery to correct those  problems, fistula formation, hysterectomy due to blood  loss, future  infertility, future tubal pregnancies if tubal sterilization is performed  due to failure rate of 1 in 400, anesthetic complications, blood clot,  stroke, pulmonary embolism, heart attack or death.  The patient states she  understands these risks and desires to proceed.                                               Katy Fitch, M.D.    DC/MEDQ  D:  05/21/2002  T:  05/21/2002  Job:  [id number]
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
## 2010-08-27 NOTE — Assessment & Plan Note (Signed)
Caitlin Stanley returns to clinic today for followup evaluation accompanied by  her husband.  The patient reports that she is doing well overall.  She  does need refills on all four of her medicines in the office today.  She  also requests trigger points as is her usual.   REVIEW OF SYSTEMS:  Noncontributory.   MEDICATIONS:  1. Oxycodone immediate release 5 mg one to two tablets p.o. b.i.d.      p.r.n. (two to four per day).  2. OxyContin sustained release 20 mg every 12 hours.  3. Valium 10 mg b.i.d. p.r.n.  4. Prozac 20 mg daily.   PHYSICAL EXAMINATION:  Well-appearing, moderately overweight adult  female in mild acute discomfort.  Blood pressure 116/63 with a pulse 72,  respiratory rate 18, and oxygen saturation 100 percent on room air.  She  has 5/5 strength throughout.  She has trigger points identified in her  trapezii bilaterally along with her thoracic region and her buttock  regions superiorly bilaterally.   IMPRESSION:  1. Lumbar spondylosis with anular disc bulge without disc herniation.  2. 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  3. Chronic low back pain with mid thoracic/buttocks/trapezii trigger      points.   In the office today we did refill the patient's four medicines as noted  above.  Unfortunately, she may have to pay the higher co-pay for the  OxyContin CR as the generic may not be available as the supply is  dwindled at the pharmacy.   We did have the patient sign a consent for trigger point injections.  I  prepared a 10 mL syringe with 8 mL of 1 percent lidocaine and 1 mL of  Kenalog 40.  We identified six areas, two in her trapezii, two in her  mid thoracic region, and two in her upper buttocks each bilaterally.  Each area was cleansed with an alcohol pad and injected with  approximately 1.5 mL of the above noted mixture.  The patient tolerated  the procedure well and good hemostasis was obtained to each site.  She  was chaperoned in the office for  the injections along with her husband  being present.   We will plan on seeing the patient in followup in this office in  approximately two months' time with refill of medication prior to that  appointment as necessary.           ______________________________  Ellwood Dense, M.D.     DC/MedQ  D:  06/21/2006 09:57:12  T:  06/23/2006 00:28:35  Job #:  [id number]

## 2010-08-27 NOTE — Assessment & Plan Note (Signed)
Ms. Capasso returns to the clinic today for followup evaluation.  She reports  that she has been experiencing increased back pain as she frequently needs  to lift her 25-month-old child.  She also has twins at home which she cares  for on a daily basis.  She has undergone trigger point injections with  lidocaine and Kenalog, October 23, 2003, and gained good relief.  She does have  a history of epidural steroid injections back in the Spring to early Summer  of 2003.  She unfortunately required a blood patch after the last injection  and is considering a repeat epidural steroid injection but would like to try  other treatment prior to that repeat injection.  She would like to have  trigger point injections in the office today as she had in July 2005.  She  also requests that her pain medicines be adjusted, as she still is taking  the OxyContin CR 10 mg q.12h. and oxycodone on an as needed basis,  approximately three tablets per day.   MEDICATIONS:  1.  Oxycodone 5 mg, 1-2 tablets p.o. b.i.d. p.r.n. (approximately three per      day).  2.  OxyContin CR 10 mg q.12h.  3.  Effexor SR 75 mg every day.   PHYSICAL EXAMINATION:  GENERAL:  A well-appearing, slightly overweight,  adult female.  VITAL SIGNS:  Blood pressure 98/73 with a pulse of 80, respiratory rate 20,  and O2 saturation 99% on room air.  MUSCULOSKELETAL:  She has 5-/5 strength throughout the bilateral upper and  lower extremities.  She does complain of trigger point in her bilateral  buttocks and mid upper thoracic region posteriorly.   IMPRESSION:  1.  Lumbar spondylosis with angular disk bulge without herniation per MRI      study, March 2003.  2.  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  3.  Chronic low back pain.  4.  Mid thoracic/buttock trigger points.   In the clinic today, I did increase her OxyContin CR to 20 mg one tablet  q.12h. and gave her a prescription as of January 09, 2004.  She will be  presently  using her OxyContin 10 mg two tablets p.o. b.i.d. to come to that  same dose.  The present prescription of the OxyContin CR 10 mg will last her  until approximately the end of September when this new prescription will  start in place.   In the clinic today, the patient did sign a consent for the trigger point  injections.  I prepared a 5 cc syringe with 4 cc of 1% lidocaine and 1 cc of  Kenalog 40.  The patient identified the trigger points of her bilateral  buttocks and in her mid thoracic region.  Each area was injected with  approximately 1 to 1.5 cc of the mixture noted above.  The patient tolerated  the procedure well with good hemostasis.   I will plan on seeing the patient in followup in approximately six weeks'  time, as we are planning to do repeat injections on a more frequent basis to  hopefully avoid epidural steroid injections which she has had a problem with  in the past.       DC/MedQ  D:  01/02/2004 12:18:38  T:  01/02/2004 23:19:49  Job #:  [id number]

## 2010-08-27 NOTE — Assessment & Plan Note (Signed)
Caitlin Stanley returns to the clinic today for followup evaluation. She reports  that she is doing reasonably well on her medicines. She does have some  nausea and some vague feelings of uneasiness and she is not sure if this is  due to her immediate release or controlled release pain medicines. She plans  to keep a closer tab on those to see exactly what may be causing her  symptoms as noted. She reports that she has been trying to walk better and  longer distances and notes some increased pain. She would like to try water  aerobics. She had been attending water aerobics at a [employer] back in the 1990's  and that was apparently at low cost or possibly even paid for by her  insurance. She is not sure if either of those would cover her at the present  time.   MEDICATIONS:  1.  Oxycodone immediate release 5 mg, 1-2 tablets p.o. b.i.d. p.r.n. (2-3      per day).  2.  OxyContin CR 20 mg q.12 hours.  3.  Effexor XR 75 mg q.d.   PHYSICAL EXAMINATION:  GENERAL:  A well appearing slightly overweight adult  female in mild acute discomfort.  VITAL SIGNS:  Vitals were not obtained in the office today.   She has 5-/5 strength throughout the bilateral upper and lower extremities.  Bulk and tone were normal, reflexes were 2+ and symmetrical. She ambulates  without any assistive device. She does complain of trigger points of her  bilateral buttocks and mid thoracic region posteriorly.   IMPRESSION:  1.  Lumbar spondylosis with annular disk bulge without disk herniation.  2.  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  3.  Chronic low back pain.  4.  Mid thoracic/buttock trigger points.   No refills were necessary in the office today. We did give her a  prescription for water aerobics and she will be checking into her insurance  to see if they will cover her or if possibly because of her income the [employer]  will be able to allow her to use the water aerobics program.   We have identified three trigger  points, one of her buttocks bilaterally and  one beneath her bra line posteriorly. She did sign a consent for the trigger  point injections and we prepared an ATC syringe with 7 mL of 1% lidocaine  and 1 mL of Kenalog 40.  I injected each site with approximately 2 to 2 1/2  mL of the mixture. Good hemostasis was obtained at each site and the patient  tolerated the procedure well. Will plan on seeing her in followup in  approximately two months time.      DC/MedQ  D:  06/24/2004 12:20:47  T:  06/24/2004 12:46:30  Job #:  [id number]
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
## 2010-08-27 NOTE — Assessment & Plan Note (Signed)
Caitlin Stanley returns to clinic today for followup evaluation.  She is getting  reasonable relief from her two pain medicines, specifically OxyContin  sustained release q.12h. and her oxycodone immediate release used 5 mg one  to two tablets b.i.d. (2 to 3 per day).  She also complains of trigger  points that are causing her pain and would like to have those injected in  the office today.   MEDICATIONS:  1. Oxycodone immediate release 5 mg 1 to 2 tablets p.o. b.i.d. p.r.n. (2      to 3 per day).  2. OxyContin sustained release 20 mg q.12h.  3. Valium 10 mg b.i.d. p.r.n.   REVIEW OF SYSTEMS:  Noncontributory.   PHYSICAL EXAMINATION:  GENERAL:  Well-appearing, moderately overweight adult  female in mild to no acute discomfort.  VITAL SIGNS:  Blood pressure 136/87, pulse 77, respiratory rate 16, and O2  saturation 99% on room air.   She ambulates without any assistive device.  She identifies trigger points  in her mid-thoracic region along with one in her left buttock and two in her  trapezius muscles bilaterally.   IMPRESSION:  1. Lumbar spondylosis with annular disk bulge without disk herniation.  2. 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  3. Chronic low back pain.  4. Mid-thoracic/buttock trigger points.   In the office today, we did refill the patient's pain medicines,  specifically the oxycodone immediate release and the OxyContin sustained  release, as noted above.  We also had her sign for trigger point injections  on a consent form.  I prepared a 10 mL with 9 mL of 1% lidocaine and 1 mL of  Kenalog 40.  I cleansed each of the 5 areas and then injected each with  approximately 1 to 1.5 mL of the above-noted mixture.  The areas involved  her left buttock, thoracic region at approximately T6 bilaterally, along  with trapezius musculature bilaterally.  The patient tolerated the procedure  well at each of the 5 injection sites.   We will plan on seeing the patient in  followup in this office in  approximately 2 months' time, with refills prior to that appointment as  necessary.           ______________________________  Ellwood Dense, M.D.     DC/MedQ  D:  01/04/2006 15:25:08  T:  01/06/2006 11:52:12  Job #:  [id number]

## 2010-08-27 NOTE — Assessment & Plan Note (Signed)
Monday, February 24, 2006   Caitlin Stanley returns to clinic today for followup evaluation. She reports that  she is getting reasonable pain relief overall. She has developed some  depression related to her brother's recent suicide. She has her brother's  son in the house living with her and that seems to remind her on a regular  basis. Her primary care physician who had been working as her psychiatrist  has left that practice. She is in need of a new referral for psychiatric  care along with antidepressant medication. We had tried her on Effexor in  the past but that caused her to be too jittery. She has been taking Prozac  and feels that would be effective for her. She developed severe headaches  with Wellbutrin in the past.   MEDICATIONS:  1. Oxycodone immediate release 5 mg, 1-2 tablets p.o., b.i.d., p.r.n. (2-3      per day).  2. OxyContin sustained release 20 mg q.12 hours.  3. Valium 10 mg b.i.d., p.r.n.  4. Prozac 20 mg daily.   REVIEW OF SYSTEMS:  Noncontributory.   PHYSICAL EXAMINATION:  GENERAL:  Well-appearing, moderately overweight adult  female in mild acute discomfort.  VITAL SIGNS:  Blood pressure 144/99 with pulse of 82, respiratory rate 16  and no desaturation, 99% on room air.  MUSCULOSKELETAL:  She has areas of trigger point pain in her buttocks  bilaterally along with her lower lumbar region, on in her mid thoracic  region and the left trapezius region.   IMPRESSION:  1. Lumbar spondylosis with anular disk bulge without disk herniation.  2. 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  3. Chronic low back pain.  4. Mid thoracic/buttock trigger points.   PLAN:  In the office today we did refill the patient's Valium along with her  Oxycodone sustained release, Oxycodone immediate release and her Prozac 20  mg daily.   In terms of her trigger points, we had her sign a consent for trigger point  injections. I prepared a 10 mL syringe with 8 mL of 1% lidocaine  and 1 mL of  Kenalog 40. I cleansed each of the 6 areas with an alcohol pad and then  injected approximately 1.5 mL into each area. The patient tolerated the  procedure well with good hemostasis at each of the 6 injection sites.   Will plan on seeing the patient in followup in this office in approximately  2 months' time.           ______________________________  Ellwood Dense, M.D.     DC/MedQ  D:  02/24/2006 12:01:42  T:  02/24/2006 13:54:54  Job #:  [id number]

## 2010-08-27 NOTE — Discharge Summary (Signed)
   NAMEKRYSTALE, Caitlin Stanley                        ACCOUNT NO.:  [id number]   MEDICAL RECORD NO.:  [id number]                   PATIENT TYPE:  INP   LOCATION:  9127                                 FACILITY:  [facility]   PHYSICIAN:  Ivor Costa. Farrel Gobble, M.D.              DATE OF BIRTH:  1965/07/20   DATE OF ADMISSION:  05/27/2002  DATE OF DISCHARGE:  05/30/2002                                 DISCHARGE SUMMARY   DISCHARGE DIAGNOSES:  1. Intrauterine pregnancy at 39 weeks, delivered.  2. History of prior cesarean section, desires repeat cesarean section.  3. Multiparity, desires sterility.  4. Status post repeat low transverse cesarean section and postpartum tubal     sterilization procedure by Dr. Katy Fitch on May 27, 2002.   HISTORY:  This is a 35-years-of-age female gravida 3 para 2 with an EDC of  May 31, 2002.  Prenatal course had been complicated by having a prior  cesarean section for twin gestation.  The patient desired repeat cesarean  section.  The patient was also advanced maternal age and underwent  amniocentesis which revealed normal chromosomes.  Also was noted to have a  fibroid uterus and did desire to have her tubes tied with this pregnancy.   HOSPITAL COURSE:  On May 27, 2002 the patient was admitted at 39+ weeks  and underwent a repeat low transverse cesarean section and postpartum tubal  sterilization by Dr. Katy Fitch and underwent the delivery of a female,  Apgars of 7 and 8, weight of 7 pounds 12 ounces.  There were no  complications.  It was noted there was a large fundal fibroid of the uterus.  Postoperatively, the patient remained afebrile, voiding, stable condition,  and she was discharged to home in satisfactory condition on May 30, 2002 and given [facility] Gynecology postpartum instructions and postpartum  booklet.   ACCESSORY CLINICAL FINDINGS/LABORATORY DATA:  The patient is O positive,  rubella immune.  On May 28, 2002,  hemoglobin was 10.   DISPOSITION:  1. The patient was discharged to home.  2. Informed to return to the office in six weeks; if she had any problem     prior to that time to be seen in the office.  3. Given a prescription for Tylox p.r.n. pain.     Susa Loffler, P.A.                    Ivor Costa. Farrel Gobble, M.D.    TSG/MEDQ  D:  06/28/2002  T:  06/28/2002  Job:  [id number]

## 2010-08-27 NOTE — Assessment & Plan Note (Signed)
SUMMARY:  Caitlin Stanley returns to the clinic today accompanied by her small son.  The patient is reporting that she needs a refill on her three medicines in  the office today.  She continues to get good relief from the medicines along  with periodic injections.  She is also asking for trigger point injections  in the office today.   MEDICATIONS:  1.  Oxycodone immediate release 5 mg 1-2 tablets p.o. twice a day (2-3 per      day).  2.  OxyContin SR 20 mg q.12 hours.  3.  Valium 10 mg twice a day p.r.n.   REVIEW OF SYSTEMS:  The review of systems is noncontributory.   PHYSICAL EXAMINATION:  GENERAL APPEARANCE:  This is a well-appearing,  moderately adult female in mild-to-no acute distress.  VITAL SIGNS:  Blood pressure 144/93 with a pulse of 88, respiratory rate 16  and O2 saturation 98% on room air.  MUSCULOSKELETAL:  The patient identifies trigger points of her mid thoracic  spine, bilateral trapezii and buttock superomedially.   IMPRESSION:  1.  Lumbar spondylosis with annular disk bulge without disk herniation.  2.  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  3.  Chronic low-back pain.  4.  Mid thoracic/buttock trigger points.   DISCUSSION:  In the office today we did refill the patient's Valium,  OxyContin Sustained Release and oxycodone immediate release.   We did have the patient sign a consent for trigger point injections.  I  prepared a 10-mL syringe with 8 mL of 1% lidocaine and 1 mL Kenalog 40.  I  cleansed each area, i.e. one in each trapezius muscle along with one in the  mid thoracic region towards the midline and then one in her upper buttock  towards the midline on each side.  Each trigger are was cleansed with an  alcohol pad and then injected with approximately 1.5 mL of the above-noted  mixture.  The patient tolerated the procedure well and good hemostasis was  obtained at each of the six sites.   PLAN:  We will plant to see the patient in follow up in this  office in  approximately two to three months time with refills prior to that  appointment if necessary.  She continues to get good relief from her  medicines overall.           ______________________________  Ellwood Dense, M.D.     DC/MedQ  D:  11/04/2005 15:31:15  T:  11/04/2005 18:13:45  Job #:  [id number]

## 2010-08-27 NOTE — Assessment & Plan Note (Signed)
Caitlin Stanley returns to the clinic today for a followup evaluation.  She last  had her trigger point injections done on February 09, 2004.  These gave her  good relief for at least 4+ weeks.  She does report that she has noticed  increased pain over the past week or so, and she would like repeat  injections.  She has approximately three areas that she needs the injections  in.  She also needs a refill on her medication.  She is planning to be in  [place] to visit a cousin who is suffering from metastatic breast cancer.  We will try to give her a sufficient medicine supply to cover her until she  gets back.  She is really not sure when she will get back but hopefully mid-  January or so.   MEDICATIONS:  1.  Oxycodone immediate release 1-2 tablets b.i.d. p.r.n. (approximately      three per day).  2.  OxyContin CR 10 mg q.12h.  3.  Effexor XR 75 mg every day.   PHYSICAL EXAMINATION:  GENERAL:  A well-appearing, slightly overweight,  adult female in mild acute discomfort.  VITAL SIGNS:  Blood pressure and vitals were not obtained.  MUSCULOSKELETAL:  She has 5-/5 strength throughout the bilateral upper and  lower extremities.  Bulk and tone were normal.  Reflexes were 2+ and  symmetrical.  She identifies one area of her left lumbar, one area just off  to the right of the midline, and an area in her rhomboids on the right side  where she has pain.   IMPRESSION:  1.  Lumbar spondylosis with anular disk bugle without disk herniation.  2.  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  3.  Chronic low back pain/  4.  Mid thoracic/buttock trigger points.   In the clinic today:  1.  I did refill her OxyContin CR and oxycodone immediate release      medications.  These should cover her for the next six weeks.  2.  I also had her sign a consent and we had her chaperoned in the office      for the injection.  I prepared a 7 cc syringe with 1% lidocaine mixed      with 1 cc of Kenalog 40.  I  injected each site, after cleansing the site      with an alcohol pad.  Each site received approximately 2 to 2.5 cc of      the mixture.  The patient tolerated the procedure well and good      hemostasis was obtained.  3.  We will plan on seeing the patient in followup in this office in      approximately six weeks' time with refills prior to that appointment as      necessary.       DC/MedQ  D:  03/19/2004 12:16:01  T:  03/19/2004 16:19:11  Job #:  [id number]

## 2010-08-27 NOTE — Assessment & Plan Note (Signed)
HISTORY OF PRESENT ILLNESS:  Caitlin Stanley returns to the clinic today for follow  up evaluation.  She reports that she is doing reasonably well overall,  although, she does have trigger point injections that she would like to have  treatment for in the office today.  She is accompanied into the office today  by her mother.  She still reports reasonable relief with the Oxycodone,  immediate release 5 mg used anywhere from 1-2 tablets p.o. b.i.d.  She  generally uses 2-3 per day.  She also uses the OxyContin CR 20 mg one tablet  q.12 h.  Her twins are starting kindergarten today.   MEDICATIONS:  1.  Oxycodone Immediate Release 5 mg 1-2 tablets p.o. b.i.d. p.r.n. (2-3 per      day).  2.  OxyContin CR 20 mg q.12 h.  3.  Wellbutrin two tablets daily.   REVIEW OF SYSTEMS:  Noncontributory.   PHYSICAL EXAMINATION:  GENERAL APPEARANCE:  Well-appearing, moderately  overweight adult female in mild acute discomfort.  VITAL SIGNS:  Blood pressure 120/78, pulse 70, respirations 16, O2  saturation 100% on room air.  EXTREMITIES:  She has 5-/5 strength throughout the bilateral upper and lower  extremities.  Bulk and tone were normal.  Reflexes were 2+ and symmetrical.  She complains of trigger points for bilateral upper buttocks and in the  midline inner thoracic region at approximately T6.   IMPRESSION:  1.  Lumbar spondylosis with annular disk bulge without disk herniation.  2.  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  3.  Chronic low back pain.  4.  Mid thoracic/buttock trigger point.   PLAN:  In the office today, we did refill the patient's Oxycodone and  OxyContin medications as of today, December 10, 2004.   We did have the patient sign a consent for the trigger point injections.  I  have prepared a 10 cc syringe with 6 cc of 1% lidocaine and 1cc of Kenalog  40.  I cleansed three areas, two in her upper buttocks and one in her mid  thoracic spine.  Each area was injected with  approximately 1.5-2 cc of the  above mixture.  Good hemostasis was obtained at each site.  The patient will  be seen in follow up in approximately 2-3 months time with refill of  medication prior to that appointment if necessary.           ______________________________  Ellwood Dense, M.D.     DC/MedQ  D:  12/10/2004 10:38:41  T:  12/10/2004 11:22:25  Job #:  [id number]
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
## 2010-08-27 NOTE — Assessment & Plan Note (Signed)
The patient returns to clinic today for follow-up evaluation.  She underwent  trigger point injections 01/02/04.  She gained relief until approximately  02/03/04.  She still notes increased pain when she tries to lift her 76-  month-old from the floor and carry the child on a regular basis.  She does  need a refill on her OxyContin and oxycodone medications in the office  today.  She also requests repeat trigger point injections.   MEDICATIONS:  1.  Oxycodone 5 mg 1-2 tablets p.o. b.i.d. p.r.n. (approximately 3 per day).      2. OxyContin CR 10 mg q.12h.  3. Effexor XR 75 mg daily.   PHYSICAL EXAMINATION:  Well-appearing, slightly overweight adult female.  Blood pressure 124/77, pulse 80, respiratory rate 16, O2 saturation 100% on  room air.  Strength 5-/5 strength bilateral upper and lower extremities.  Bulk and tone were normal.  Reflexes 2+ and symmetrical.   The patient identifies one area in her mid thoracic region posteriorly along  with one area at the left buttocks and one just off to the right of her  midline lumbar scar.   IMPRESSION:  1.  Lumbar spondylosis with annular disc bulge without herniated disc.  2.  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  3.  Chronic low back pain.  4.  Mid thoracic/buttock trigger point.   PLAN:  In the clinic today, I did refill the OxyContin and oxycodone  medications.  I have also had her sign a consent and we prepared her for  trigger point injections.  I prepared a syringe with 2 cc of 1% lidocaine  along with 1 cc of Kenalog 40.  The three trigger point sites were  identified and cleansed and each was injected with approximately 1 cc of the  mixture.  The patient tolerated the procedure well and good hemostasis was  obtained.  The patient was chaperoned in the clinic following the injections  and had already signed a consent form.   We will plan on seeing the patient in followup in this office in  approximately 6 weeks' time for  repeat injections as necessary and refills  as necessary.       DC/MedQ  D:  02/09/2004 12:13:04  T:  02/09/2004 13:09:12  Job #:  [id number]

## 2010-08-27 NOTE — Assessment & Plan Note (Signed)
DATE OF EVALUATION:  October 08, 2004.   MEDICAL RECORD NUMBER:  [id number].   Ms. Kozlowski returns to the clinic today for followup evaluation.  She reports  that she does need a refill on her pain medicines.  She is using OxyContin  20 mg q.12h. and oxycodone 5 mg one to two tablets p.o. b.i.d. p.r.n.  The  OxyContin was increased to 20 mg approximately two visits ago.   The patient reports that her primary care physician has switched her from  Effexor to Wellbutrin two tablets daily.  She feels that is doing better for  her and she is experiencing less anxiousness.   MEDICATIONS:  1.  Oxycodone immediate release 5 mg one to two tablets p.o. b.i.d. p.r.n.      (two to three per day).  2.  OxyContin CR 20 mg q.12h.  3.  Wellbutrin two tablets daily.   PHYSICAL EXAMINATION:  GENERAL APPEARANCE:  A well-appearing, moderately  overweight, adult female in mild acute discomfort.  VITAL SIGNS:  Blood pressure 132/75 with a pulse if 79, a respiratory rate  of 16 and an O2 saturation of 95% on room air.  EXTREMITIES:  She has 5-/5 strength throughout the bilateral upper and lower  extremities.  Bulk and tone were normal.  BACK:  She does complain of trigger points of her bilateral buttocks, one in  her lower lumbar region and one in her mid thoracic region.  She wishes to  have trigger point injections done in those areas.   IMPRESSION:  1.  Lumbar spondylosis with annular disk bulge without disk herniation.  2.  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  3.  Chronic low back pain.  4.  Mid thoracic/buttock trigger points.   In the office today, we did refill her oxycodone and OxyContin medications  as of October 13, 2004.   We had the patient sign a consent for trigger point injections.  I prepared  a 6 mL syringe with 1% lidocaine and 1 mL of Kenalog 40 mg.  I cleansed the  four areas that she wished to have trigger point injections done.  Two were  in her buttock regions, one on each  side with one over the scar in her  lumbar region and one in her mid thoracic region.  Each site was cleansed  with an alcohol pad and then injected with approximately 1.5 mL of the  mixture noted above.  The patient tolerated the procedure well and good  hemostasis was obtained.   We will plan on seeing the patient in followup in approximately two months'  time.       DC/MedQ  D:  10/08/2004 11:46:38  T:  10/08/2004 12:14:50  Job #:  [id number]

## 2010-08-27 NOTE — Op Note (Signed)
Caitlin Stanley                        ACCOUNT NO.:  [id number]   MEDICAL RECORD NO.:  [id number]                   PATIENT TYPE:  INP   LOCATION:  9198                                 FACILITY:  [facility]   PHYSICIAN:  Katy Fitch, M.D.               DATE OF BIRTH:  1966/02/10   DATE OF PROCEDURE:  05/27/2002  DATE OF DISCHARGE:                                 OPERATIVE REPORT   PREOPERATIVE DIAGNOSES:  1. Elective repeat cesarean section.  2. Multiparity desiring sterility.   POSTOPERATIVE DIAGNOSES:  1. Elective repeat cesarean section.  2. Multiparity desiring sterility.   PROCEDURE:  1. Repeat low transverse cesarean section.  2. Postpartum tubal sterilization.   SURGEON:  Katy Fitch, M.D.   ASSIST:  Nadyne Coombes. Fontaine, M.D.   ANESTHESIA:  Spinal.   ESTIMATED BLOOD LOSS:  700.   URINE OUTPUT:  650 and clear.   FLUIDS:  2600 crystalloid.   COMPLICATIONS:  None.   SPECIMENS:  Cord blood.   FINDINGS:  A living 7 pound 12 ounce female with Apgars of 8 and 9 with three-  vessel cord.  Normal tubes and ovaries.  There is a large fundal fibroid of  the uterus.   PROCEDURE:  The patient was taken to the operating room where spinal  anesthesia was administered.  The patient was prepped and draped in normal  sterile fashion.  A Foley catheter was inserted into bladder and clear urine  return was noted.  A Pfannenstiel incision was made through the previous  incision down to the underlying fascia.  The fascia was incised with a  scalpel and extended laterally with curved Mayo scissors.  Kocher clamps  were placed on the inferior fascial border and the underlying rectus muscles  were dissected off both bluntly and sharply with curved Mayo scissors.  Kochers were then placed in the superior fascial border and in a similar  fashion the rectus muscles dissected off both bluntly and sharply with  curved Mayo scissors.  Entrance into the abdominal peritoneal  cavity was  then performed.  The rectus muscles were separated sharply using a knife due  to scar tissue in the medium raphe, and this was taken down to the level of  the pubic bone.  The peritoneum was then stretched down to the level of the  bladder.  Bladder blade was placed in, the vesicouterine peritoneum  identified and incised with Metzenbaum scissors and extended transversely.  A bladder flap was then created digitally.  A bladder blade placed in the  newly created bladder flap and a low transverse uterine incision was made  with a scalpel.  This incision was extended digitally.  The amniotic cavity  was then entered using an Allis clamp.  The infant's head was then grasped  in the occiput area.  The infant was then delivered atraumatically.  The  mouth and nose were suctioned.  The cord  was clamped and cut and the infant  handed off to the awaiting NICU attendants.  Cord blood was then obtained.  The placenta was then massaged from the uterus.  Warm lap was then used to  clean the interior of the uterus.  The uterus was too big to exteriorize due  to the fundal fibroid and at this point closure of the low transverse  uterine incision was done with the uterus interior.  A running 1-0 chromic  interlocking stitch was used to close the segment.  The lower segment was  then packed.  The left fallopian tube was then identified and grasped with a  Babcock clamp and the medium portion of the fallopian tube was doubly  ligated with 3-0 plain and then excised with Metzenbaum scissors.  Hemostasis was noted.  This was then done similarly on the right with  identification of the tube out to the fimbriated end, grasped in the middle  portion of the tube, and doubly ligated with 3-0 plain suture and excised  with Metzenbaum scissors.  This also appeared to be hemostatic.  The tube  and ovary returned to its normal resting position.  The tape was then  removed from the incision.  There was one  area in the middle of the incision  that did have minimal bleeding.  A figure-of-eight suture was then placed to  obtain hemostasis.  There was then noticed several areas below this that had  vessels that were Bovie cauterized which became hemostatic.  The pelvis was  irrigated with warm normal saline.  Once again, the incision and the portion  below the lower uterine segment were isolated and appeared to be hemostatic.  The rectus muscles were then reapproximated manually without any stitches.  The fascia was then reapproximated using #0 Vicryl in a running continuous  stitch and started at the corner and met in the midline.  The subcutaneous  tissue was then irrigated with warm normal saline and several areas of  cautery were used to obtain hemostasis and the skin was closed using  staples.  The patient was taken to recovery room in stable condition.                                               Katy Fitch, M.D.    DC/MEDQ  D:  05/27/2002  T:  05/27/2002  Job:  [id number]

## 2010-08-27 NOTE — Assessment & Plan Note (Signed)
MEDICAL RECORD NUMBER:  [id number].   Caitlin Stanley returns to clinic today for followup evaluation. He reports that  she has been doing fairly well at more or less her baseline until about a  week and a half ago. At that time, she had friends visiting from out of  town, and she gave them her bed to sleep in. She slept on the floor and has  noticed increased low back and left neck pain since that time. She does  report that she has been using the oxycodone but reports that she gets only  temporary relief. She would like to be considered for a longer term pain  control. She uses approximately four to six of the oxycodone a day at the  present time.   MEDICATIONS:  1. Oxycodone 5 mg one to two tablets p.o. b.i.d. p.r.n. (four to six per     day).  2. Effexor XR 75 mg q.d.   PHYSICAL EXAMINATION:  Well appearing overweight adult female. Blood  pressure 144/86 with a pulse of 91, respiratory rate 14, and O2 saturation  100% on room air. Five minus/five strength was present throughout the  bilateral upper and lower extremities. There was complaints of pain on the  left side with manual muscle testing. The patient ambulates with an antalgic  gait on her left side.   IMPRESSION:  1. Lumbar spondylosis with annular disk bulge without herniation per MRI     study March 2003.  2. 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  3. Chronic low back pain.  4. Mid thoracic/buttock trigger point.   In the clinic today, we did decide to start her on OxyContin 10 mg CR one  tablet q.12h. I have also asked her to decrease her OxyContin down to one or  two tablets a day and that prescription was also refilled for her in the  office today.   She did identify three trigger points and signed a consent for trigger point  injections. She was chaperoned in the room, and the three trigger points  were identified. One was in her left trapezius region, one in her rhomboid  region in the midline, and one in  her left upper gluteus maximus. The  patient was injected with a 25-gauge needle at each site using a combination  of approximately 1.5 to 2 cc of 1% lidocaine along with 0.33 cc of Kenalog  40. The patient tolerated the procedure well at the three injection sites,  and good hemostasis was obtained.   We will plan on seeing the patient in follow up in approximately three  months' time with refills of medication prior to that appointment.      Ellwood Dense, M.D.   DC/MedQ  D:  10/23/2003 12:14:35  T:  10/23/2003 13:05:23  Job #:  [id number]

## 2010-08-27 NOTE — Assessment & Plan Note (Signed)
Caitlin Stanley returns to clinic today for followup evaluation. She reports  that overall she is doing well. We have refilled her Prozac for her in  past and need to do so today as she is still awaiting a new psychiatric  evaluation in February. She does need refill on her other pain medicines  as noted below. She also requests trigger point injections in the office  today.   MEDICATIONS:  1. Oxycodone immediate release 5 mg 1 to 2 tablets p.o. b.i.d. p.r.n.      (2 to 3 per day).  2. OxyContin sustained release 20 mg q. 12 hours.  3. Valium 10 mg b.i.d. p.r.n.  4. Prozac 20 mg q.d.   REVIEW OF SYSTEMS:  Noncontributory.   PHYSICAL EXAMINATION:  Well-appearing mildly overweight adult female, in  mild acute discomfort. Blood pressure 115/84, with pulse 64, respiratory  rate 16, and O2 saturation 99% on room air. She has trigger points of  her bilateral trapezius, mid thoracic region, lower lumber region, and  bilateral buttocks.   IMPRESSION:  1. Lumbar spondylosis with annular disc bulge without disc herniation.  2. Anterior wedge deformity of thoracic spine.  3. Chronic low back pain.  4. Mid thoracic/buttock/trapezius trigger points.   In the office today we did refill the patient's Prozac, along with her  oxycodone immediate release, oxycodone sustained release, and valium  medications. We also had her sign a consent for trigger point  injections. I prepared a 10 cc syringe with 8 cc of 1% lidocaine and 1cc  of Kenalog 40. I cleansed 2 areas of her trapezius, 1 on her mid  thoracic region, 1 on her lower lumber region, and 2 on her buttocks.  Each of this areas was injected after being cleansed with and alcohol  pad. Approximately one and a half cc of the lidocaine/Kenalog mixture  was injected into each site and the patient tolerated the procedure  well. Good hemostasis was obtained at each injection site.   We will plan on seeing the patient in follow up in this office in  approximately 2 months time with refilled medication prior to that  appointment as necessary.           ______________________________  Ellwood Dense, M.D.     DC/MedQ  D:  04/21/2006 09:09:07  T:  04/21/2006 10:00:37  Job #:  [id number]

## 2010-08-27 NOTE — Assessment & Plan Note (Signed)
MEDICAL RECORD NUMBER:  [id number]   [name] returns to the clinic today for follow-up evaluation. She reports  that she is having more spasms of her neck, especially on her left side, and  these radiate into her mid back and left lower lumbar region. She would like  injections for the trigger points in the office today. She reports that she  gets reasonable relief with the OxyContin and oxycodone medication as noted  below. She also reports that she went to the emergency room recently and was  given a minimal dose of Valium. She would like to have that available to her  on an as-needed basis for muscle spasms.   MEDICATIONS:  1.  Oxycodone immediate release 5 mg one to two tablets p.o. b.i.d. p.r.n.      (two to three per day).  2.  OxyContin CR 20 mg q.12h.  3.  Wellbutrin two tablets daily.   REVIEW OF SYSTEMS:  Noncontributory.   PHYSICAL EXAMINATION:  Well-appearing moderately-overweight adult female in  mild discomfort. Blood pressure was 132/90 with a pulse of 75, respiratory  rate 16, and O2 saturation 100% on room air. She has 5-/5 strength  throughout the bilateral upper and lower extremities. She complains of  trigger point of her cervical region, thoracic region, and lumbar region on  the left side, along with her right buttock.   IMPRESSION:  1.  Lumbar spondylosis with anular disc bulge without disc herniation.  2.  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  3.  Chronic low back pain.  4.  Mid thoracic/buttock trigger points.   In the office today, we did refill the patient's OxyContin and oxycodone  medication. We also gave her a new prescription for Valium 2 mg to be used  one tablet p.o. b.i.d. p.r.n., a total of #60 with no refills.   We did have the patient sign a consent for trigger point injections of her  cervical, thoracic, and lumbar/hip region. I prepared a 10 mL syringe with 8  mL of 1% lidocaine along with 1 mL of Kenalog 40. I cleansed one area  in her  left trapezius and one area in her mid cervical region. Each of these areas  was injected with approximately 1 to 1.5 mL of the mixture. I then cleansed  an area in her mid thoracic region on the left side and injected that area  also with 1.5 mL of the mixture. I then cleansed two areas, one of each  lateral buttock. Each area was cleansed and then injected with 1 to 1.5 mL  of the mixture. The patient tolerated the procedure well and good hemostasis  was obtained at each site.   We will plan on seeing the patient in follow-up in this office in  approximately 2 months' time.           ______________________________  Ellwood Dense, M.D.     DC/MedQ  D:  02/10/2005 10:03:47  T:  02/10/2005 11:11:38  Job #:  [id number]

## 2010-08-27 NOTE — Op Note (Signed)
[facility] of [facility]  Patient:    Caitlin Stanley, Caitlin Stanley                MRN: [id number] Proc. Date: 07/23/00 Adm. Date:  [id number] Attending:  Merrily Pew                           Operative Report  PREOPERATIVE DIAGNOSES:       1. Pregnancy at 35-36 weeks.                               2. Twin gestation, breech presentation of                                  twin A.                               3. Spontaneous contractions.                               4. Elevated blood pressure.                               5. Leiomyomata.  POSTOPERATIVE DIAGNOSES:      1. Pregnancy at 35-36 weeks.                               2. Twin gestation, breech presentation of                                  twin A.                               3. Spontaneous contractions.                               4. Elevated blood pressure.                               5. Leiomyomata.  PROCEDURE:                    Primary low transverse cervical cesarean section.  SURGEON:                      Timothy P. Fontaine, M.D.  ASSISTANT:                    Scrub technician.  ANESTHESIA:                   Regional.  ESTIMATED BLOOD LOSS:         500 cc.  COMPLICATIONS:                None.  SPECIMENS:                    Samples of cord blood x 2.  Placenta with twin A cord marked  with umbilical clamp.  FINDINGS:                     At 31, twin A, normal female, Apgars 8 and 9, weight 4 lb 14 oz, frank breech presentation.  At 1443, normal female, twin B, Apgars 7 and 8, weight 5 lb 9 oz, vertex presentation.  A 3 cm pedunculated anterior uterine leiomyomata noted, left undisturbed.  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  The ovaries were grossly normal to palpation and limited inspection, although difficult to visualize due to the large myoma.  DESCRIPTION OF PROCEDURE:     The patient was taken to the operating room and underwent regional  anesthesia.  She was placed in the left tilt supine position and received an abdominal preparation of Betadine scrub and Betadine solution.  A Foley catheter was placed using sterile technique.  The patient was draped in the usual fashion and, after assuring adequate anesthesia, the abdomen was sharply entered through a Pfannenstiel incision.  Achieving adequate hemostasis at all levels.  A bladder flap was sharply and bluntly developed without difficulty.  The uterus was sharply entered in the lower uterine segment and bluntly extended laterally.  Bulging membranes were ruptured and the fluid noted to be clear.  Twin A was delivered in the frank breech presentation through a breech extraction.  The nares and mouth were suctioned, the cord doubly clamped and cut and the infant was handed to pediatrics in attendance.  The bulging twin B membranes were then ruptured. The infant was found to be in the vertex presentation, although it was converted to a footling breech and underwent a breech extraction without difficulty.  The nares and mouth were suctioned, the cord doubly clamped and cut and the infant handed to pediatrics in attendance.  Samples of cord blood were obtained from both twin A and twin B.  Twin A cord was identified with a cord clamp.  The placenta was then spontaneously extruded, noted to be intact, and was sent to pathology.  Due to the large myomas, the uterus could not be exteriorized, and the endometrial cavity was explored with a sponge to remove all placental membrane fragments.  The angles of the incision were identified with ring forceps.  Subsequently, the uterine incision was closed using 0 Vicryl suture in a running interlocking stitch.  A small oozing site in the mid incision line was secured using an interrupted figure-of-eight 0 Vicryl suture.  The incision was copiously irrigated.  Adequate hemostasis was visualized.   Palpation of the adnexa bilaterally and  attempted visualization was grossly normal, although somewhat limited.  The anterior fascia was then reapproximated using 0 Vicryl suture in a running stitch.  The subcutaneous tissue was irrigated.  Adequate hemostasis was achieved with electrocautery. The skin was reapproximated with staples.  A sterile dressing was applied and the patient was taken to the recovery room in good condition, having tolerated the procedure well.  The patient did receive antibiotic prophylaxis following cord clamping. DD:  07/23/00 TD:  07/23/00 Job: [id number]

## 2010-08-27 NOTE — Discharge Summary (Signed)
[facility] of [facility]  Patient:    Caitlin Stanley, Caitlin Stanley                MRN: [id number] Adm. Date:  [id number] Disc. Date: [id number] Attending:  Merrily Pew Dictator:   Antony Contras, N.P.                           Discharge Summary  DISCHARGE DIAGNOSES:          Intrauterine pregnancy at 35-36 weeks, twin gestation with breech presentation of twin A, spontaneous contractions, elevated blood pressure, leiomyomata.  PROCEDURE:                    Primary low cervical transverse cesarean section with delivery of viable twin infants.  HISTORY OF PRESENT ILLNESS:   Patient is a 45 year old gravida 2, para 0-0-1-0 with an LMP of November 18, 2000, [facility] of Aug 25, 2000.  Prenatal risk factors include diamniotic dichorionic twins, anemia, elevated blood pressure, preterm labor.  PRENATAL LABORATORIES:        Blood type O+.  Antibody screen negative. Sickle cell negative.  Toxoplasmosis negative.  RPR, HBSAG, HIV nonreactive.  HOSPITAL COURSE:              Patient was admitted on July 23, 2000 after presenting to the triage area at [facility] complaining of lower abdominal discomfort.  On that admission she was found to have a blood pressure of 171/102.  She had also had a history of preterm labor and had been on Brethine.  She was found to have some mild irritability on external monitors.  Repeated blood pressures were found to be 163/106, 141/75.  PIH laboratories revealed negative liver function studies.  She was found to have a platelet count of 135.  Urine was negative for proteinuria.  It was decided at this point to admit the patient for delivery by cesarean section due to her elevated blood pressures and also the breech presentation of twin A.  Low cervical transverse cesarean section was performed by Dr. Audie Box.  Patient was delivered of viable female twin infants, Apgars 8/9, 7/8, weight 4 pounds 14 ounces, 5 pounds 9 ounces respectively.   Patient was also found to have a 3 cm pedunculated anterior uterine leiomyomata and also a 9 x 10 cm posterior intramural myoma.  The ovaries were found to be normal.  Postoperatively patient remained afebrile.  Blood pressures ranged on day #1 from 130/80-150/11.  By her second postpartum day 130/80 was the normal blood pressure.  She was able to be discharged in satisfactory condition on her third postoperative day.  LABORATORIES:                 CBC:  Hematocrit 26.5, hemoglobin 9.1, WBC 105, platelets 139,000.  DISPOSITION:                  Follow-up in six weeks.  Continue with prenatal vitamins and iron, Motrin and Tylox for pain. DD:  08/21/00 TD:  08/21/00 Job: [id number]

## 2010-08-27 NOTE — Assessment & Plan Note (Signed)
[name] returns to the clinic today for followup evaluation. She just got  back from [place] after having visited her cousin who is experiencing  metastatic breast cancer.  The patient herself, [name], is having  increased pain from the long drive that she did with her mother and three  small children. She does need a refill on her OxyContin and oxycodone  medication.  She also requests her normal trigger point injections in the  office today.   MEDICATIONS:  1.  Oxycodone immediate relief 5 mg, 1-2 tablets __________ b.i.d. p.r.n.      (approximately 3 per day).  2.  OxyContin CR 10 mg, 1 tablet q. 8-12 hours prn  3.  Effexor XR 75 mg q.d.   PHYSICAL EXAMINATION:  Well appearing slightly over weight adult female in  mild acute discomfort.  Blood pressure of 121/77 with a pulse of 83 and O2 saturation 100% on room  air. She identifies four different trigger point areas of her back, two in  her buttocks, one in the lower lumbar region, one in the midline in the  thoracic region.   IMPRESSION:  1.  Lumbar spondylosis with annular disk bulge without disk herniation.  2.  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  3.  Chronic low back pain.  4.  Mid thoracic/buttock trigger points.   In the clinic today, did refill the OxyContin and oxycodone medications as  of today.  We have also had her sign a consent.  She agreed to the  injections which we have done previously and she has tolerated well.  I have  prepared a 7 mL syringe with 1% lidocaine mixed with 1 mL of Kenalog 40.  We  cleansed the sites in her mid thoracic, lower lumbar and upper buttock  regions bilaterally.  Each site was injected with approximately 1 to 1 1/2  mL of the mixture noted above.  The patient tolerated the procedure well and  good hemostasis was obtained.   Will plan on seeing the patient in followup in this office in approximately  two months time with refills prior to that appointment as  necessary.       DC/MedQ  D:  04/28/2004 14:08:12  T:  04/28/2004 14:45:14  Job #:  [id number]

## 2010-08-27 NOTE — H&P (Signed)
[facility] of [facility]  Patient:    Caitlin Stanley, Caitlin Stanley                MRN: [id number] Adm. Date:  [id number] Attending:  Michaelle Copas                         History and Physical  CHIEF COMPLAINT:              1. Pregnancy at 35-36 weeks.                               2. Twin gestation.                               3. Lower abdominal discomfort.  HISTORY OF PRESENT ILLNESS:   [age]-year-old G2, P0, AB1 female at 35-36 weeks, history of twin gestation, who presented to triage area complaining of lower abdominal discomfort.  The patient had an episode of sharp, stabbing lower abdominal pain which actually resolved by the time she arrived at the hospital but was found on registration to have a blood pressure of 171/102. The patient has a history of preterm labor and had been on Brethine, with her last dose taken at 12 midnight, having been evaluated at approximately 45 minutes later.  The patient was found to have some mild irritability on external monitors, and on repeated blood pressures were found to have a blood pressure of 163/106, 141/75.  The patient was admitted for observation and preeclampsia laboratory studies.  The patients follow-up laboratory studies showed her urine to be negative for proteinuria with negative liver function studies and was found to have a platelet count of 135.  The patients uric acid was 5.5.  On serial blood pressure monitoring, the patients blood pressures have remained elevated in the 143/93, 160/94, and most recently 145/107.  The patient denies overt symptomatology such as headache, blurred vision, epigastric pain, swelling.  Her pregnancy had been uncomplicated except for her twins and preterm contractions, for which she was on terbutaline.  She was noted at her last OB visit last week to have a mildly elevated blood pressure in the 140/80 range by her history, although this is not documented.  PAST  MEDICAL HISTORY:         Uncomplicated.  PAST SURGICAL HISTORY:        1. D&C.                               2. Neck and back surgery.                               3. Three knee surgeries.  ALLERGIES:                    None.  MEDICATIONS:                  Vitamins and her Brethine.  REVIEW OF SYSTEMS:            Noncontributory.  SOCIAL HISTORY:               No cigarette or alcohol use.  ADMISSION PHYSICAL EXAMINATION:  VITAL SIGNS:  Most recent blood pressure 145/107.  HEENT:                        Normal.  LUNGS:                        Clear.  CARDIAC:                      Regular rate.  No rubs, murmurs, or gallops.  ABDOMEN:                      Gravid uterus.  Ultrasound evaluation shows twin A in breech presentation, twin B in vertex.  External monitor shows reactive fetal tracings bilaterally without regular contractions.  PELVIC:                       Cervix exam by triage nurse was closed.  EXTREMITIES:                  DTRs are 2+, negative clonus.  ASSESSMENT:                   [age]-year-old G2, P0, AB1 female, 35-36 weeks.  Elevated blood pressures.  Repeated platelet count 135, 134. Remainder of [facility] laboratories all normal.  The situation was reviewed with the patient.  The option of expected management with serial blood pressure monitoring versus delivery now was reviewed with her.  The risks of both choices were also discussed to include the risk of prematurity if delivery now versus the risks worsening maternal condition with frank complications of hypertension such as HELLP syndrome, seizures.  Given the patients twin gestation, breech presentation on twin A, 35-36 weeks, and her persistently elevated blood pressures on left-sided rest, I feel certainly that we have little to gain by expectant management long-term, and the most prudent would be to proceed with cesarean section.  I discussed with the patient what is involved with  cesarean delivery to include the risks of transfusion, up to and including HIV, wound complications including opening and draining, closure by secondary intention, internal infections requiring prolonged antibiotics, and inadvertent injury to internal organs including bowel, bladder, ureters, vessels, and nerves necessitating major exploratory reparative surgeries, future reparative surgeries including ostomy formation.  The patients questions were answered to her satisfaction.  She feels strongly that she wants to go ahead and proceed with cesarean section, and we will proceed with this today. DD:  07/23/00 TD:  07/23/00 Job: [id number]

## 2011-01-06 LAB — DIFFERENTIAL
Basophils Relative: 1
Eosinophils Absolute: 0.3
Eosinophils Relative: 5
Monocytes Absolute: 0.9
Monocytes Relative: 13 — ABNORMAL HIGH

## 2011-01-06 LAB — POCT URINALYSIS DIP (DEVICE)
Bilirubin Urine: NEGATIVE
Ketones, ur: NEGATIVE
Nitrite: NEGATIVE
Operator id: 239701
Protein, ur: NEGATIVE

## 2011-01-06 LAB — URINALYSIS, ROUTINE W REFLEX MICROSCOPIC
Glucose, UA: NEGATIVE
Ketones, ur: NEGATIVE
Protein, ur: NEGATIVE

## 2011-01-06 LAB — CBC
HCT: 31.8 — ABNORMAL LOW
Hemoglobin: 10.7 — ABNORMAL LOW
MCHC: 33.7
MCV: 75.7 — ABNORMAL LOW
RBC: 4.2

## 2011-01-06 LAB — HEPATIC FUNCTION PANEL
ALT: 14
AST: 29
Indirect Bilirubin: 0.2 — ABNORMAL LOW
Total Protein: 7.3

## 2011-01-06 LAB — BASIC METABOLIC PANEL
CO2: 25
Chloride: 102
Creatinine, Ser: 0.8
GFR calc Af Amer: >60

## 2011-01-06 LAB — URINE MICROSCOPIC-ADD ON

## 2011-01-06 LAB — POCT PREGNANCY, URINE
Operator id: 303351
Preg Test, Ur: NEGATIVE

## 2011-01-13 ENCOUNTER — Encounter: Payer: Self-pay | Admitting: Gynecology

## 2011-01-13 DIAGNOSIS — D219 Benign neoplasm of connective and other soft tissue, unspecified: Secondary | ICD-10-CM | POA: Insufficient documentation

## 2011-01-14 ENCOUNTER — Ambulatory Visit: Payer: Self-pay | Admitting: Obstetrics and Gynecology

## 2011-01-21 ENCOUNTER — Ambulatory Visit (INDEPENDENT_AMBULATORY_CARE_PROVIDER_SITE_OTHER): Payer: Medicare Other | Admitting: Obstetrics and Gynecology

## 2011-01-21 ENCOUNTER — Encounter: Payer: Self-pay | Admitting: Obstetrics and Gynecology

## 2011-01-21 DIAGNOSIS — N9089 Other specified noninflammatory disorders of vulva and perineum: Secondary | ICD-10-CM

## 2011-01-21 DIAGNOSIS — Z113 Encounter for screening for infections with a predominantly sexual mode of transmission: Secondary | ICD-10-CM

## 2011-01-21 DIAGNOSIS — R102 Pelvic and perineal pain: Secondary | ICD-10-CM

## 2011-01-21 DIAGNOSIS — N949 Unspecified condition associated with female genital organs and menstrual cycle: Secondary | ICD-10-CM

## 2011-01-21 NOTE — Progress Notes (Signed)
Patient came to see me today because of a lesion on her left labia. She had one previously in another spot on her labia that we though was folliculitis and treated with warm soaks and antibiotic. At that point she was concerned about STD. She had a complete assessment and all was negative. She has not been sexually active since then. She also has a recent history of lower pelvic pain radiating to her vagina and making her concerned about her fibroid we previously diagnosed. She's had no abnormal bleeding.  External genitalia: At the most distal portion of her left labia there is a slight redness and fullness underneath the surface. I believe it's another area of folliculitis but it certainly could be HSV. BUS: Within normal limits vaginal exam: Within normal limits. Cervix: Clean. Uterus: Enlarged by myoma to 10 weeks size. Adnexa: No masses. Rectovaginal examination: Confirmatory.  Assessment: Probable folliculitis. Fibroid with pelvic pain.  Plan: Pelvic ultrasound scheduled. Area of left labia touched with a 25-gauge needle. No material obtained. HSV culture obtained. Discussed folliculitis and avoiding increased moisture. We will inform about HSV culture. She will return for ultrasound.

## 2011-02-07 ENCOUNTER — Other Ambulatory Visit: Payer: Medicare Other

## 2011-02-07 ENCOUNTER — Ambulatory Visit: Payer: Medicare Other | Admitting: Obstetrics and Gynecology

## 2011-03-07 ENCOUNTER — Encounter: Payer: Medicare Other | Admitting: Obstetrics and Gynecology

## 2011-05-25 ENCOUNTER — Emergency Department (INDEPENDENT_AMBULATORY_CARE_PROVIDER_SITE_OTHER): Payer: Medicare Other

## 2011-05-25 ENCOUNTER — Emergency Department (HOSPITAL_BASED_OUTPATIENT_CLINIC_OR_DEPARTMENT_OTHER)
Admission: EM | Admit: 2011-05-25 | Discharge: 2011-05-25 | Disposition: A | Discharge status: Home / Self Care | Payer: Medicare Other | Attending: Emergency Medicine | Admitting: Emergency Medicine

## 2011-05-25 ENCOUNTER — Encounter (HOSPITAL_BASED_OUTPATIENT_CLINIC_OR_DEPARTMENT_OTHER): Payer: Self-pay | Admitting: Emergency Medicine

## 2011-05-25 DIAGNOSIS — R109 Unspecified abdominal pain: Secondary | ICD-10-CM

## 2011-05-25 DIAGNOSIS — Z79899 Other long term (current) drug therapy: Secondary | ICD-10-CM | POA: Insufficient documentation

## 2011-05-25 DIAGNOSIS — R1031 Right lower quadrant pain: Secondary | ICD-10-CM

## 2011-05-25 DIAGNOSIS — D259 Leiomyoma of uterus, unspecified: Secondary | ICD-10-CM

## 2011-05-25 HISTORY — DX: Dorsalgia, unspecified: M54.9

## 2011-05-25 HISTORY — DX: Other chronic pain: G89.29

## 2011-05-25 LAB — LIPASE, BLOOD: Lipase: 30 U/L (ref 11–59)

## 2011-05-25 LAB — URINALYSIS, ROUTINE W REFLEX MICROSCOPIC
Ketones, ur: NEGATIVE mg/dL
Leukocytes, UA: NEGATIVE
Nitrite: NEGATIVE
Urobilinogen, UA: 0.2 mg/dL (ref 0.0–1.0)
pH: 7.5 (ref 5.0–8.0)

## 2011-05-25 LAB — COMPREHENSIVE METABOLIC PANEL
BUN: 11 mg/dL (ref 6–23)
CO2: 25 mEq/L (ref 19–32)
Chloride: 99 mEq/L (ref 96–112)
Creatinine, Ser: 0.9 mg/dL (ref 0.50–1.10)
GFR calc non Af Amer: 76 mL/min — ABNORMAL LOW (ref >90)
Total Bilirubin: 0.3 mg/dL (ref 0.3–1.2)

## 2011-05-25 LAB — DIFFERENTIAL
Basophils Relative: 1 % (ref 0–1)
Eosinophils Relative: 4 % (ref 0–5)
Lymphs Abs: 2.6 10*3/uL (ref 0.7–4.0)
Monocytes Relative: 11 % (ref 3–12)
Neutro Abs: 1.5 10*3/uL — ABNORMAL LOW (ref 1.7–7.7)

## 2011-05-25 LAB — CBC
HCT: 32.4 % — ABNORMAL LOW (ref 36.0–46.0)
MCV: 69.8 fL — ABNORMAL LOW (ref 78.0–100.0)
RBC: 4.64 MIL/uL (ref 3.87–5.11)
WBC: 4.8 10*3/uL (ref 4.0–10.5)

## 2011-05-25 MED ORDER — KETOROLAC TROMETHAMINE 30 MG/ML IJ SOLN
30.0000 mg | Freq: Once | INTRAMUSCULAR | Status: AC
  Administered 2011-05-25: 30 mg via INTRAVENOUS
  Filled 2011-05-25: qty 1

## 2011-05-25 MED ORDER — CYCLOBENZAPRINE HCL 10 MG PO TABS: 10.0000 mg | ORAL_TABLET | Freq: Two times a day (BID) | ORAL | Status: AC | PRN
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
## 2011-05-25 NOTE — Discharge Instructions (Signed)

## 2011-05-25 NOTE — ED Notes (Signed)
Pt c/o sudden onset RLQ pain when she stood up from bending over to pick something up.  Pain is constant now w/ some radiation to RT shoulder

## 2011-10-28 ENCOUNTER — Emergency Department (HOSPITAL_BASED_OUTPATIENT_CLINIC_OR_DEPARTMENT_OTHER): Payer: Medicare Other

## 2011-10-28 ENCOUNTER — Encounter (HOSPITAL_BASED_OUTPATIENT_CLINIC_OR_DEPARTMENT_OTHER): Payer: Self-pay | Admitting: *Deleted

## 2011-10-28 ENCOUNTER — Emergency Department (HOSPITAL_BASED_OUTPATIENT_CLINIC_OR_DEPARTMENT_OTHER)
Admission: EM | Admit: 2011-10-28 | Discharge: 2011-10-28 | Disposition: A | Discharge status: Home / Self Care | Payer: Medicare Other | Attending: Emergency Medicine | Admitting: Emergency Medicine

## 2011-10-28 DIAGNOSIS — X500XXA Overexertion from strenuous movement or load, initial encounter: Secondary | ICD-10-CM | POA: Insufficient documentation

## 2011-10-28 DIAGNOSIS — Z79899 Other long term (current) drug therapy: Secondary | ICD-10-CM | POA: Insufficient documentation

## 2011-10-28 DIAGNOSIS — G8929 Other chronic pain: Secondary | ICD-10-CM | POA: Insufficient documentation

## 2011-10-28 DIAGNOSIS — S93409A Sprain of unspecified ligament of unspecified ankle, initial encounter: Secondary | ICD-10-CM | POA: Insufficient documentation

## 2011-10-28 MED ORDER — IBUPROFEN 800 MG PO TABS: 800.0000 mg | ORAL_TABLET | Freq: Three times a day (TID) | ORAL | Status: AC

## 2011-10-28 NOTE — ED Notes (Signed)
Per EMS: pt was walking down the steps when she missed the last step and twisted her left ankle tonight approx ago. 10/10 pain initially. EMS states patient was screaming in pain until she received fentanyl IV, which decreased the pain to 5/10.
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
## 2012-12-13 ENCOUNTER — Emergency Department (HOSPITAL_BASED_OUTPATIENT_CLINIC_OR_DEPARTMENT_OTHER)
Admission: EM | Admit: 2012-12-13 | Discharge: 2012-12-14 | Disposition: A | Discharge status: Home / Self Care | Payer: Medicare Other | Attending: Emergency Medicine | Admitting: Emergency Medicine

## 2012-12-13 ENCOUNTER — Encounter (HOSPITAL_BASED_OUTPATIENT_CLINIC_OR_DEPARTMENT_OTHER): Payer: Self-pay | Admitting: Emergency Medicine

## 2012-12-13 DIAGNOSIS — Z79899 Other long term (current) drug therapy: Secondary | ICD-10-CM | POA: Insufficient documentation

## 2012-12-13 DIAGNOSIS — M549 Dorsalgia, unspecified: Secondary | ICD-10-CM | POA: Insufficient documentation

## 2012-12-13 DIAGNOSIS — Z8742 Personal history of other diseases of the female genital tract: Secondary | ICD-10-CM | POA: Insufficient documentation

## 2012-12-13 DIAGNOSIS — L209 Atopic dermatitis, unspecified: Secondary | ICD-10-CM

## 2012-12-13 DIAGNOSIS — Z8781 Personal history of (healed) traumatic fracture: Secondary | ICD-10-CM | POA: Insufficient documentation

## 2012-12-13 DIAGNOSIS — G8929 Other chronic pain: Secondary | ICD-10-CM | POA: Insufficient documentation

## 2012-12-13 DIAGNOSIS — L2089 Other atopic dermatitis: Secondary | ICD-10-CM | POA: Insufficient documentation

## 2012-12-13 MED ORDER — TRIAMCINOLONE ACETONIDE 0.1 % EX CREA: TOPICAL_CREAM | Freq: Two times a day (BID) | CUTANEOUS | Status: AC

## 2012-12-13 NOTE — ED Notes (Signed)
Round darkened areas on feet and ankles x1 month.  Saw pmd and was told it was "nothing".  Itching and pain continues.

## 2012-12-13 NOTE — ED Notes (Signed)
Pt states a circular rash appeared about a month ago and has spread to her other leg, both arms and her abdomen. States it itches and hurts. Pt reports h/o gonorrhea and chlamydia 2 years ago and was treated for it.
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
## 2012-12-14 MED ORDER — LORAZEPAM 2 MG/ML IJ SOLN
0.5000 mg | Freq: Once | INTRAMUSCULAR | Status: DC
  Filled 2012-12-14: qty 1

## 2012-12-14 NOTE — ED Notes (Signed)
Pt assisted to wheelchair and to waiting area for d/c - pt continues to be A&Ox4, in no acute distress, pt mildly tearful. Prior to complete d/c pt offered once again medication for her back spasms however pt continues to state "I just want to go home." D/c instructions reviewed w/ pt, pt denies any further questions at present.

## 2012-12-14 NOTE — ED Notes (Signed)
Attempted to give pt IM ativan per orders, pt states  "I just want to go home, I just want to go home." Pt declines medication at present. Pt remains A&Ox4

## 2012-12-14 NOTE — ED Notes (Signed)
Upon attempting to d/c pt - pt began to c/o back spasms, pt w/ jerking motions, remained A&Ox4 throughout event. Pt unable to verbalize which medication she normally takes for her back spasms, later during her episode she states she normally gets valium. Dr. Nicanor Alcon present and aware of all events.

## 2012-12-14 NOTE — ED Notes (Signed)
Prior to pt going from her room to her mother's exam room, she asked to go to the bathroom. EMT rolled pt into bathroom in a wheelchair and offered her assistance which she refused. A couple of minutes later, staff heard a banging noise and pt cried out. Staff went immediately into the restroom and found pt in a standing position attempting to pull up her pants. Pt was asked several times if she had fallen and pt repeatedly denied having fell. Pt finished dressing herself and then moved herself back into the wheelchair. Pt then rolled into her mother's room to await mother's disposition by EDP.

## 2012-12-14 NOTE — ED Provider Notes (Signed)
At discharge patient then complained of severe back spasms with whole body spasms says she gets these and they respond to valium.  Patient refused ativan for the spasms and they resolved spontaneously.    Jasmine Awe, MD 12/14/12 [phone]

## 2012-12-14 NOTE — ED Notes (Signed)
While awaiting pt's mother to be discharged, pt had her cousin get on the phone to tell this RN what medication she normally takes for back spasms. The cousin via phone reports pt has recently been prescribed buspirone hcl 15mg  q12hr for her back spasms. Dr. Nicanor Alcon made aware.

## 2013-08-19 ENCOUNTER — Encounter (HOSPITAL_BASED_OUTPATIENT_CLINIC_OR_DEPARTMENT_OTHER): Payer: Self-pay | Admitting: Emergency Medicine

## 2013-08-19 ENCOUNTER — Emergency Department (HOSPITAL_BASED_OUTPATIENT_CLINIC_OR_DEPARTMENT_OTHER)
Admission: EM | Admit: 2013-08-19 | Discharge: 2013-08-20 | Disposition: A | Discharge status: Home / Self Care | Payer: Medicare Other | Attending: Emergency Medicine | Admitting: Emergency Medicine

## 2013-08-19 DIAGNOSIS — R6883 Chills (without fever): Secondary | ICD-10-CM | POA: Insufficient documentation

## 2013-08-19 DIAGNOSIS — Z8781 Personal history of (healed) traumatic fracture: Secondary | ICD-10-CM | POA: Insufficient documentation

## 2013-08-19 DIAGNOSIS — R197 Diarrhea, unspecified: Secondary | ICD-10-CM | POA: Insufficient documentation

## 2013-08-19 DIAGNOSIS — R1013 Epigastric pain: Secondary | ICD-10-CM | POA: Insufficient documentation

## 2013-08-19 DIAGNOSIS — Z79899 Other long term (current) drug therapy: Secondary | ICD-10-CM | POA: Insufficient documentation

## 2013-08-19 DIAGNOSIS — IMO0002 Reserved for concepts with insufficient information to code with codable children: Secondary | ICD-10-CM | POA: Insufficient documentation

## 2013-08-19 DIAGNOSIS — R110 Nausea: Secondary | ICD-10-CM | POA: Insufficient documentation

## 2013-08-19 DIAGNOSIS — R1012 Left upper quadrant pain: Secondary | ICD-10-CM | POA: Insufficient documentation

## 2013-08-19 DIAGNOSIS — R109 Unspecified abdominal pain: Secondary | ICD-10-CM

## 2013-08-19 DIAGNOSIS — G8929 Other chronic pain: Secondary | ICD-10-CM | POA: Insufficient documentation

## 2013-08-19 DIAGNOSIS — Z9851 Tubal ligation status: Secondary | ICD-10-CM | POA: Insufficient documentation

## 2013-08-19 DIAGNOSIS — Z8742 Personal history of other diseases of the female genital tract: Secondary | ICD-10-CM | POA: Insufficient documentation

## 2013-08-19 LAB — CBC WITH DIFFERENTIAL/PLATELET
Basophils Absolute: 0 10*3/uL (ref 0.0–0.1)
Basophils Relative: 0 % (ref 0–1)
EOS PCT: 4 % (ref 0–5)
Eosinophils Absolute: 0.2 10*3/uL (ref 0.0–0.7)
HCT: 36.5 % (ref 36.0–46.0)
HEMOGLOBIN: 12.3 g/dL (ref 12.0–15.0)
LYMPHS ABS: 3 10*3/uL (ref 0.7–4.0)
LYMPHS PCT: 45 % (ref 12–46)
MCH: 26.2 pg (ref 26.0–34.0)
MCHC: 33.7 g/dL (ref 30.0–36.0)
MCV: 77.8 fL — AB (ref 78.0–100.0)
MONOS PCT: 9 % (ref 3–12)
Monocytes Absolute: 0.6 10*3/uL (ref 0.1–1.0)
NEUTROS PCT: 43 % (ref 43–77)
Neutro Abs: 2.9 10*3/uL (ref 1.7–7.7)
PLATELETS: 290 10*3/uL (ref 150–400)
RBC: 4.69 MIL/uL (ref 3.87–5.11)
RDW: 15 % (ref 11.5–15.5)
WBC: 6.7 10*3/uL (ref 4.0–10.5)

## 2013-08-19 LAB — COMPREHENSIVE METABOLIC PANEL
ALK PHOS: 74 U/L (ref 39–117)
ALT: 36 U/L — AB (ref 0–35)
AST: 28 U/L (ref 0–37)
Albumin: 4 g/dL (ref 3.5–5.2)
BUN: 5 mg/dL — ABNORMAL LOW (ref 6–23)
CO2: 24 meq/L (ref 19–32)
Calcium: 9.5 mg/dL (ref 8.4–10.5)
Chloride: 99 mEq/L (ref 96–112)
Creatinine, Ser: 0.7 mg/dL (ref 0.50–1.10)
GLUCOSE: 112 mg/dL — AB (ref 70–99)
POTASSIUM: 3.8 meq/L (ref 3.7–5.3)
SODIUM: 139 meq/L (ref 137–147)
Total Bilirubin: 0.4 mg/dL (ref 0.3–1.2)
Total Protein: 7.3 g/dL (ref 6.0–8.3)

## 2013-08-19 LAB — URINALYSIS, ROUTINE W REFLEX MICROSCOPIC
Bilirubin Urine: NEGATIVE
Glucose, UA: NEGATIVE mg/dL
HGB URINE DIPSTICK: NEGATIVE
Ketones, ur: NEGATIVE mg/dL
Leukocytes, UA: NEGATIVE
Nitrite: NEGATIVE
PH: 7 (ref 5.0–8.0)
Protein, ur: NEGATIVE mg/dL
SPECIFIC GRAVITY, URINE: 1.002 — AB (ref 1.005–1.030)
UROBILINOGEN UA: 0.2 mg/dL (ref 0.0–1.0)

## 2013-08-19 LAB — LIPASE, BLOOD: Lipase: 32 U/L (ref 11–59)

## 2013-08-19 MED ORDER — ONDANSETRON 4 MG PO TBDP
4.0000 mg | ORAL_TABLET | Freq: Once | ORAL | Status: AC
  Administered 2013-08-19: 4 mg via ORAL
  Filled 2013-08-19: qty 1

## 2013-08-19 MED ORDER — PANTOPRAZOLE SODIUM 40 MG PO TBEC
40.0000 mg | DELAYED_RELEASE_TABLET | Freq: Once | ORAL | Status: AC
  Administered 2013-08-19: 40 mg via ORAL
  Filled 2013-08-19: qty 1

## 2013-08-19 MED ORDER — GI COCKTAIL ~~LOC~~
30.0000 mL | Freq: Once | ORAL | Status: AC
  Administered 2013-08-19: 30 mL via ORAL
  Filled 2013-08-19: qty 30

## 2013-08-19 NOTE — ED Notes (Signed)
Pt reports yesterday after eating rice and beans with acute onset of abd pain, intially epigastric and now to left side of abdomen.  Nausea no vomiting.  Has had numerous episodes of diarrhea.  No fever.

## 2013-08-19 NOTE — ED Notes (Signed)
Called to patient room for reports of vomiting after given medications, pt was not vomiting, she had the hiccups, no emesis was noted. Cold wash cloth was given and placed on patient forehead, hiccups stopped and patient was repositioned
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
## 2013-08-20 MED ORDER — SUCRALFATE 1 G PO TABS: 1.0000 g | ORAL_TABLET | Freq: Three times a day (TID) | ORAL | Status: AC | PRN

## 2013-08-20 MED ORDER — ONDANSETRON 4 MG PO TBDP: ORAL_TABLET | ORAL | Status: AC

## 2013-08-20 NOTE — ED Notes (Signed)
Patient originally stated that her son was waiting for her in the waiting room, however at time of discharge she stated that she told him to leave and go to work and that she did not have a ride home, taxi called for patient to return home. Pt alert oriented and vs stable

## 2013-08-20 NOTE — Discharge Instructions (Signed)
Call and make an appointment to followup with your primary MD. Return immediately for worsening pain, persistent vomiting or for any concerns.  Abdominal Pain, Women Abdominal (stomach, pelvic, or belly) pain can be caused by many things. It is important to tell your doctor:  The location of the pain.  Does it come and go or is it present all the time?  Are there things that start the pain (eating certain foods, exercise)?  Are there other symptoms associated with the pain (fever, nausea, vomiting, diarrhea)? All of this is helpful to know when trying to find the cause of the pain. CAUSES   Stomach: virus or bacteria infection, or ulcer.  Intestine: appendicitis (inflamed appendix), regional ileitis (Crohn's disease), ulcerative colitis (inflamed colon), irritable bowel syndrome, diverticulitis (inflamed diverticulum of the colon), or cancer of the stomach or intestine.  Gallbladder disease or stones in the gallbladder.  Kidney disease, kidney stones, or infection.  Pancreas infection or cancer.  Fibromyalgia (pain disorder).  Diseases of the female organs:  Uterus: fibroid (non-cancerous) tumors or infection.  Fallopian tubes: infection or tubal pregnancy.  Ovary: cysts or tumors.  Pelvic adhesions (scar tissue).  Endometriosis (uterus lining tissue growing in the pelvis and on the pelvic organs).  Pelvic congestion syndrome (female organs filling up with blood just before the menstrual period).  Pain with the menstrual period.  Pain with ovulation (producing an egg).  Pain with an IUD (intrauterine device, birth control) in the uterus.  Cancer of the female organs.  Functional pain (pain not caused by a disease, may improve without treatment).  Psychological pain.  Depression. DIAGNOSIS  Your doctor will decide the seriousness of your pain by doing an examination.  Blood tests.  X-rays.  Ultrasound.  CT scan (computed tomography, special type of  X-ray).  MRI (magnetic resonance imaging).  Cultures, for infection.  Barium enema (dye inserted in the large intestine, to better view it with X-rays).  Colonoscopy (looking in intestine with a lighted tube).  Laparoscopy (minor surgery, looking in abdomen with a lighted tube).  Major abdominal exploratory surgery (looking in abdomen with a large incision). TREATMENT  The treatment will depend on the cause of the pain.   Many cases can be observed and treated at home.  Over-the-counter medicines recommended by your caregiver.  Prescription medicine.  Antibiotics, for infection.  Birth control pills, for painful periods or for ovulation pain.  Hormone treatment, for endometriosis.  Nerve blocking injections.  Physical therapy.  Antidepressants.  Counseling with a psychologist or psychiatrist.  Minor or major surgery. HOME CARE INSTRUCTIONS   Do not take laxatives, unless directed by your caregiver.  Take over-the-counter pain medicine only if ordered by your caregiver. Do not take aspirin because it can cause an upset stomach or bleeding.  Try a clear liquid diet (broth or water) as ordered by your caregiver. Slowly move to a bland diet, as tolerated, if the pain is related to the stomach or intestine.  Have a thermometer and take your temperature several times a day, and record it.  Bed rest and sleep, if it helps the pain.  Avoid sexual intercourse, if it causes pain.  Avoid stressful situations.  Keep your follow-up appointments and tests, as your caregiver orders.  If the pain does not go away with medicine or surgery, you may try:  Acupuncture.  Relaxation exercises (yoga, meditation).  Group therapy.  Counseling. SEEK MEDICAL CARE IF:   You notice certain foods cause stomach pain.  Your home care  treatment is not helping your pain.  You need stronger pain medicine.  You want your IUD removed.  You feel faint or lightheaded.  You  develop nausea and vomiting.  You develop a rash.  You are having side effects or an allergy to your medicine. SEEK IMMEDIATE MEDICAL CARE IF:   Your pain does not go away or gets worse.  You have a fever.  Your pain is felt only in portions of the abdomen. The right side could possibly be appendicitis. The left lower portion of the abdomen could be colitis or diverticulitis.  You are passing blood in your stools (bright red or black tarry stools, with or without vomiting).  You have blood in your urine.  You develop chills, with or without a fever.  You pass out. MAKE SURE YOU:   Understand these instructions.  Will watch your condition.  Will get help right away if you are not doing well or get worse. Document Released: 01/23/2007 Document Revised: 06/20/2011 Document Reviewed: 02/12/2009 [facility] Patient Information 2014 [place], [place].

## 2014-02-10 ENCOUNTER — Encounter (HOSPITAL_BASED_OUTPATIENT_CLINIC_OR_DEPARTMENT_OTHER): Payer: Self-pay | Admitting: Emergency Medicine

## 2014-08-05 ENCOUNTER — Emergency Department (HOSPITAL_BASED_OUTPATIENT_CLINIC_OR_DEPARTMENT_OTHER)
Admission: EM | Admit: 2014-08-05 | Discharge: 2014-08-05 | Disposition: A | Discharge status: Home / Self Care | Payer: Medicare Other | Attending: Emergency Medicine | Admitting: Emergency Medicine

## 2014-08-05 ENCOUNTER — Emergency Department (HOSPITAL_BASED_OUTPATIENT_CLINIC_OR_DEPARTMENT_OTHER): Payer: Medicare Other

## 2014-08-05 ENCOUNTER — Encounter (HOSPITAL_BASED_OUTPATIENT_CLINIC_OR_DEPARTMENT_OTHER): Payer: Self-pay | Admitting: *Deleted

## 2014-08-05 DIAGNOSIS — Z8742 Personal history of other diseases of the female genital tract: Secondary | ICD-10-CM | POA: Diagnosis not present

## 2014-08-05 DIAGNOSIS — S93401A Sprain of unspecified ligament of right ankle, initial encounter: Secondary | ICD-10-CM | POA: Insufficient documentation

## 2014-08-05 DIAGNOSIS — S8011XA Contusion of right lower leg, initial encounter: Secondary | ICD-10-CM | POA: Diagnosis not present

## 2014-08-05 DIAGNOSIS — W1839XA Other fall on same level, initial encounter: Secondary | ICD-10-CM | POA: Insufficient documentation

## 2014-08-05 DIAGNOSIS — Z7952 Long term (current) use of systemic steroids: Secondary | ICD-10-CM | POA: Insufficient documentation

## 2014-08-05 DIAGNOSIS — S3992XA Unspecified injury of lower back, initial encounter: Secondary | ICD-10-CM | POA: Diagnosis not present

## 2014-08-05 DIAGNOSIS — D563 Thalassemia minor: Secondary | ICD-10-CM | POA: Insufficient documentation

## 2014-08-05 DIAGNOSIS — Y9389 Activity, other specified: Secondary | ICD-10-CM | POA: Insufficient documentation

## 2014-08-05 DIAGNOSIS — G8929 Other chronic pain: Secondary | ICD-10-CM | POA: Diagnosis not present

## 2014-08-05 DIAGNOSIS — S93601A Unspecified sprain of right foot, initial encounter: Secondary | ICD-10-CM | POA: Insufficient documentation

## 2014-08-05 DIAGNOSIS — Y998 Other external cause status: Secondary | ICD-10-CM | POA: Diagnosis not present

## 2014-08-05 DIAGNOSIS — K219 Gastro-esophageal reflux disease without esophagitis: Secondary | ICD-10-CM | POA: Diagnosis not present

## 2014-08-05 DIAGNOSIS — Z79899 Other long term (current) drug therapy: Secondary | ICD-10-CM | POA: Insufficient documentation

## 2014-08-05 DIAGNOSIS — S73102A Unspecified sprain of left hip, initial encounter: Secondary | ICD-10-CM | POA: Diagnosis not present

## 2014-08-05 DIAGNOSIS — S79912A Unspecified injury of left hip, initial encounter: Secondary | ICD-10-CM | POA: Diagnosis present

## 2014-08-05 DIAGNOSIS — Y9289 Other specified places as the place of occurrence of the external cause: Secondary | ICD-10-CM | POA: Diagnosis not present

## 2014-08-05 DIAGNOSIS — R52 Pain, unspecified: Secondary | ICD-10-CM

## 2014-08-05 HISTORY — DX: Gastro-esophageal reflux disease without esophagitis: K21.9

## 2014-08-05 HISTORY — DX: Thalassemia minor: D56.3

## 2014-08-05 HISTORY — DX: Reserved for inherently not codable concepts without codable children: IMO0001

## 2014-08-05 MED ORDER — OXYCODONE-ACETAMINOPHEN 5-325 MG PO TABS
2.0000 | ORAL_TABLET | Freq: Once | ORAL | Status: AC
  Administered 2014-08-05: 2 via ORAL
  Filled 2014-08-05: qty 2

## 2014-08-05 NOTE — ED Notes (Signed)
Pt returned from Radiology.
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
## 2014-08-05 NOTE — ED Notes (Signed)
Patient states she fell while stepping off of a trailer ramp after getting her foot caught on a wire two days ago.  States her right foot caught the wire and she fell approximately one foot, landing on her left buttocks. Now has pain, swelling and bruising in her right lower leg, and pain in her left buttocks.  Has kept the right leg elevated and iced per the school nurse.

## 2015-05-15 IMAGING — DX DG LUMBAR SPINE COMPLETE 4+V
5 series · 5 of 5 positions shown · non-contrast
Comparison: MRI 04/15/2012

CLINICAL DATA: Fall 2 days ago, low back pain, left hip pain.

EXAM:
LUMBAR SPINE - COMPLETE 4+ VIEW

[l-spine ap]
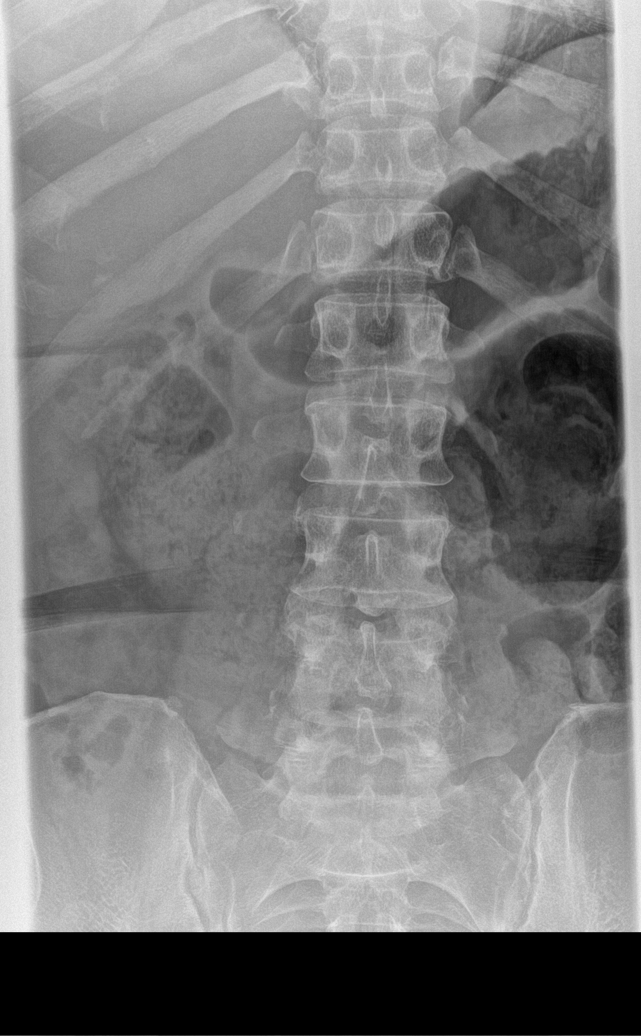

[l-spine obl (1 of 2)]
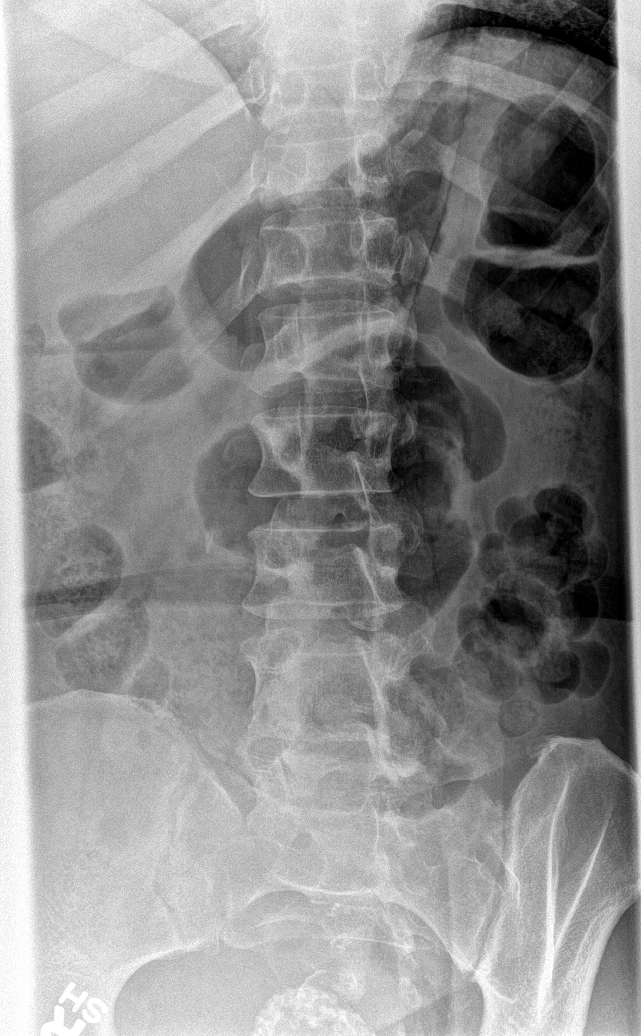

[l-spine obl (2 of 2)]
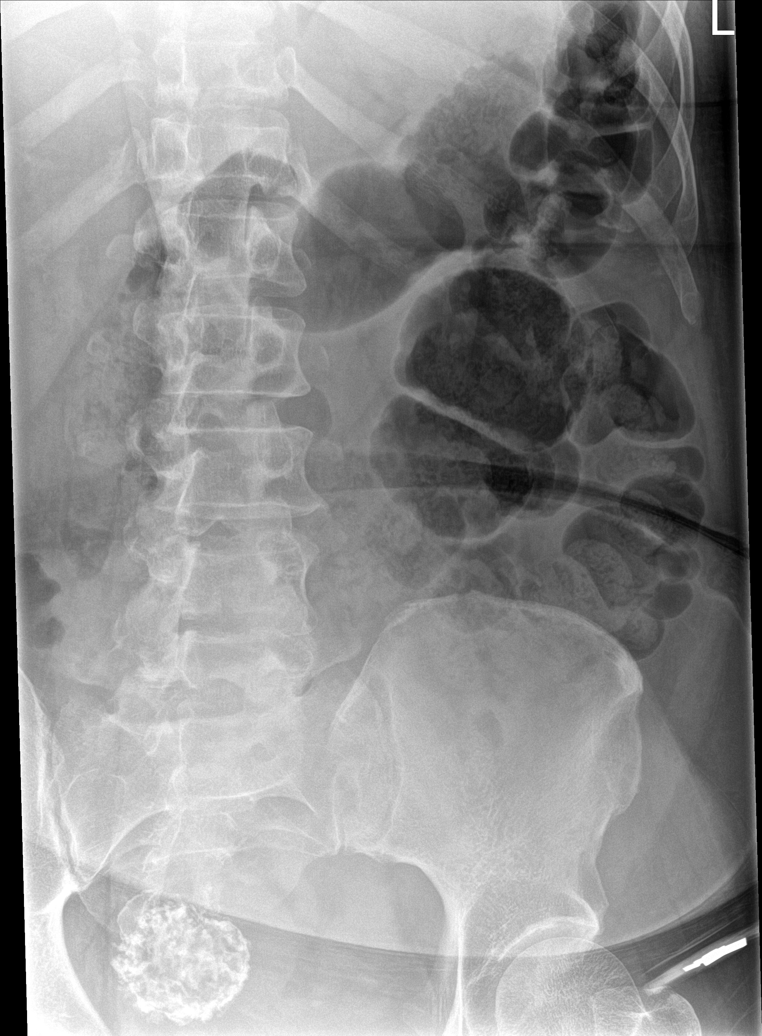

[l-spine lat]
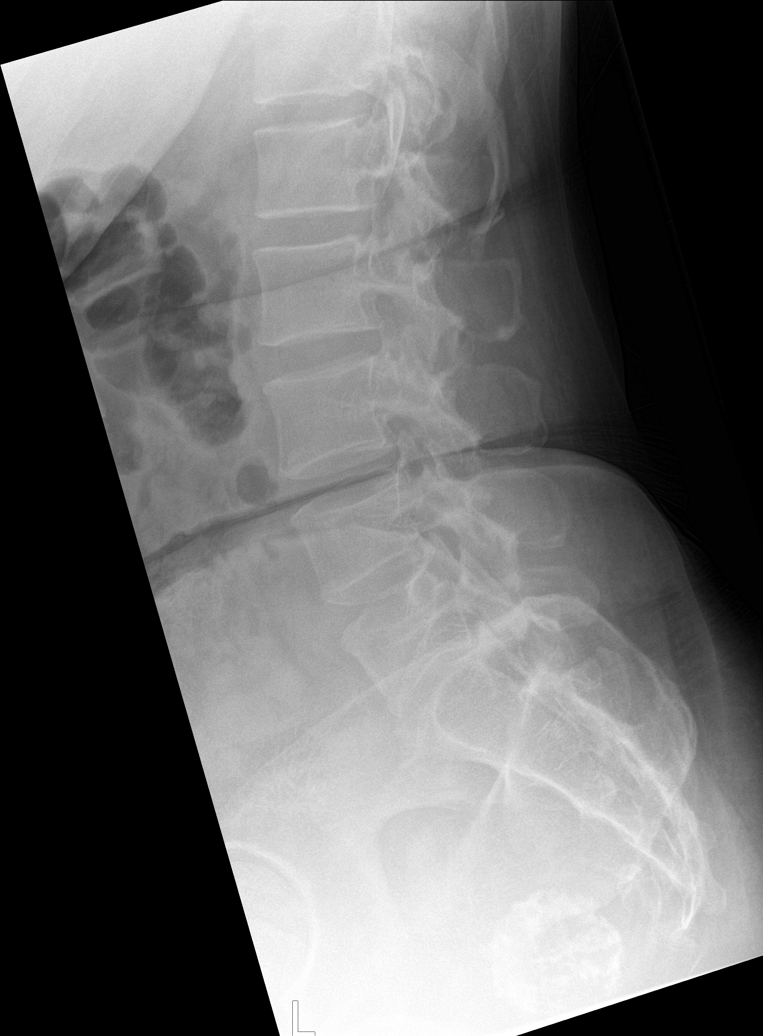

[l-spine spot]
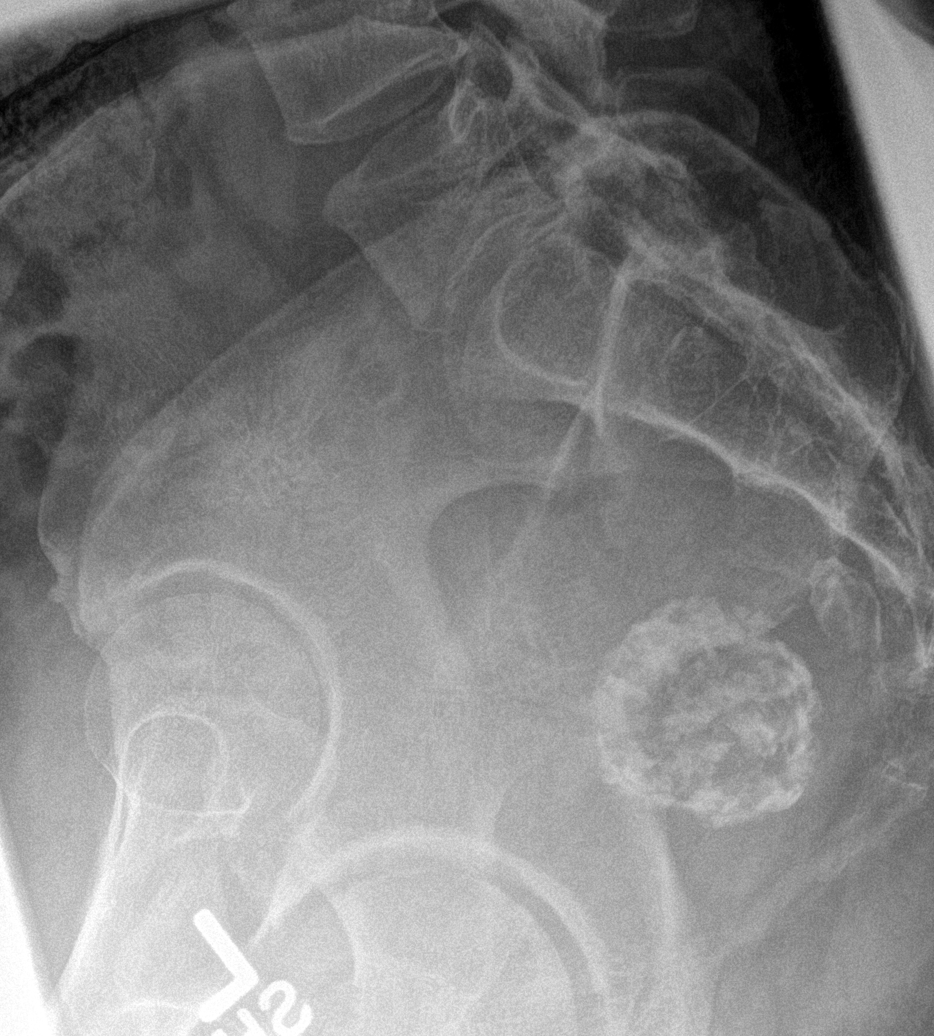

[5 of 5 positions shown; findings below may reference images not displayed]

FINDINGS: Slight disc space narrowing at L5-S1. Normal alignment. No fracture.
SI joints are symmetric and unremarkable.

Calcified fibroid in the pelvis.
IMPRESSION: No acute bony abnormality.

## 2015-05-15 IMAGING — DX DG ANKLE COMPLETE 3+V*R*
3 series · 3 of 3 positions shown · non-contrast
Comparison: None currently available

CLINICAL DATA: Fall 2 days ago with right ankle pain. Initial
encounter.

EXAM:
RIGHT ANKLE - COMPLETE 3+ VIEW

[ankle ap]
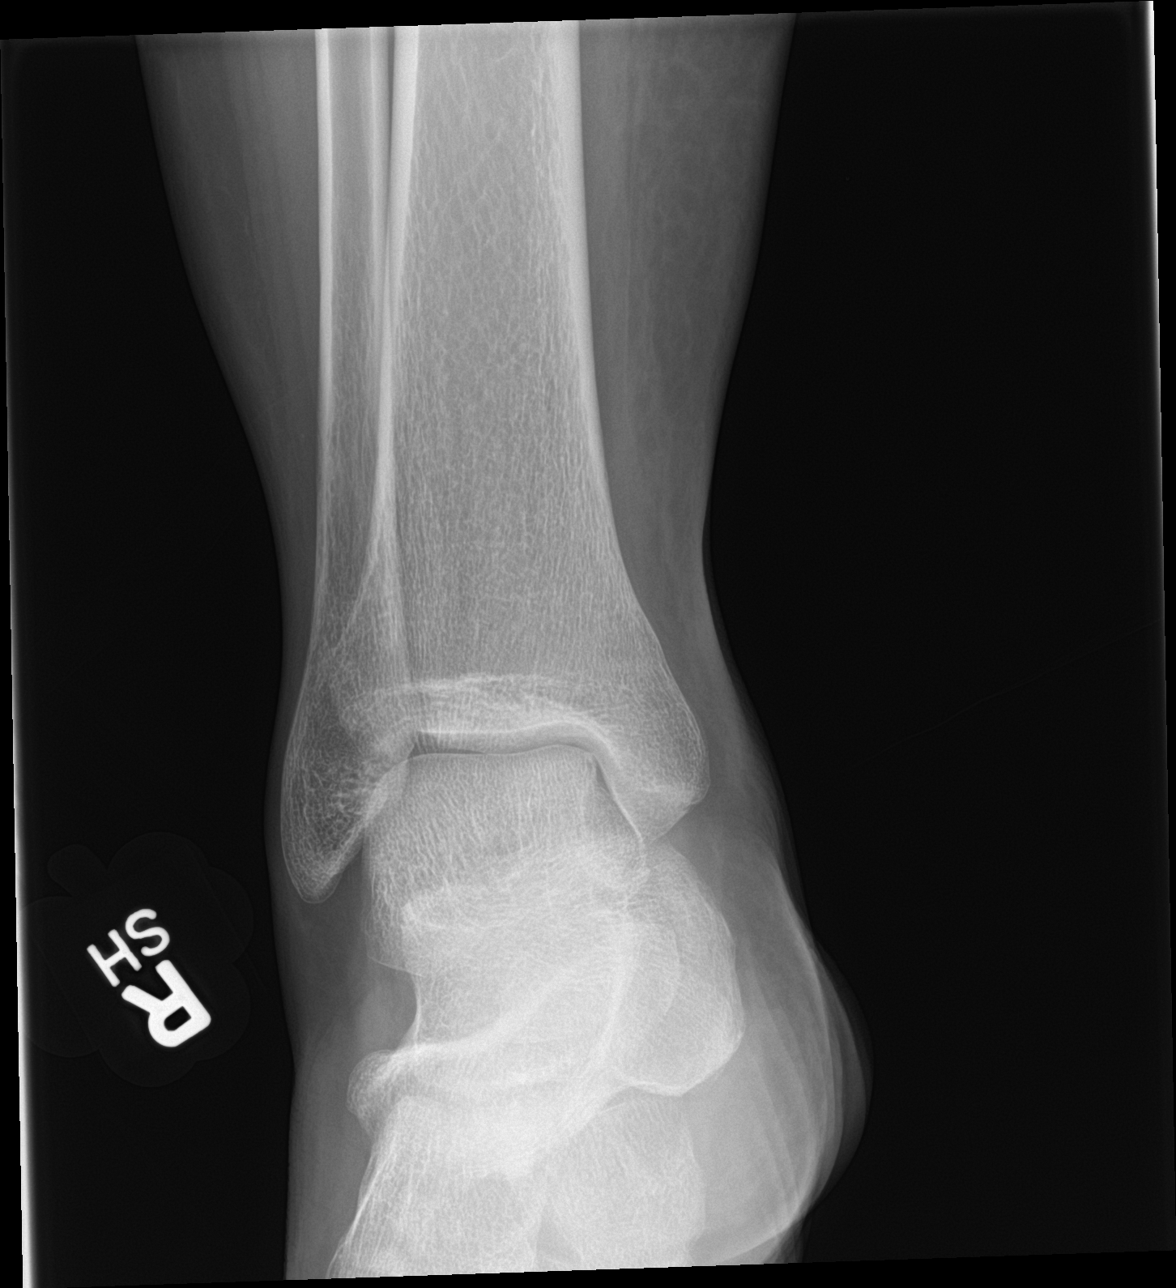

[ankle obl]
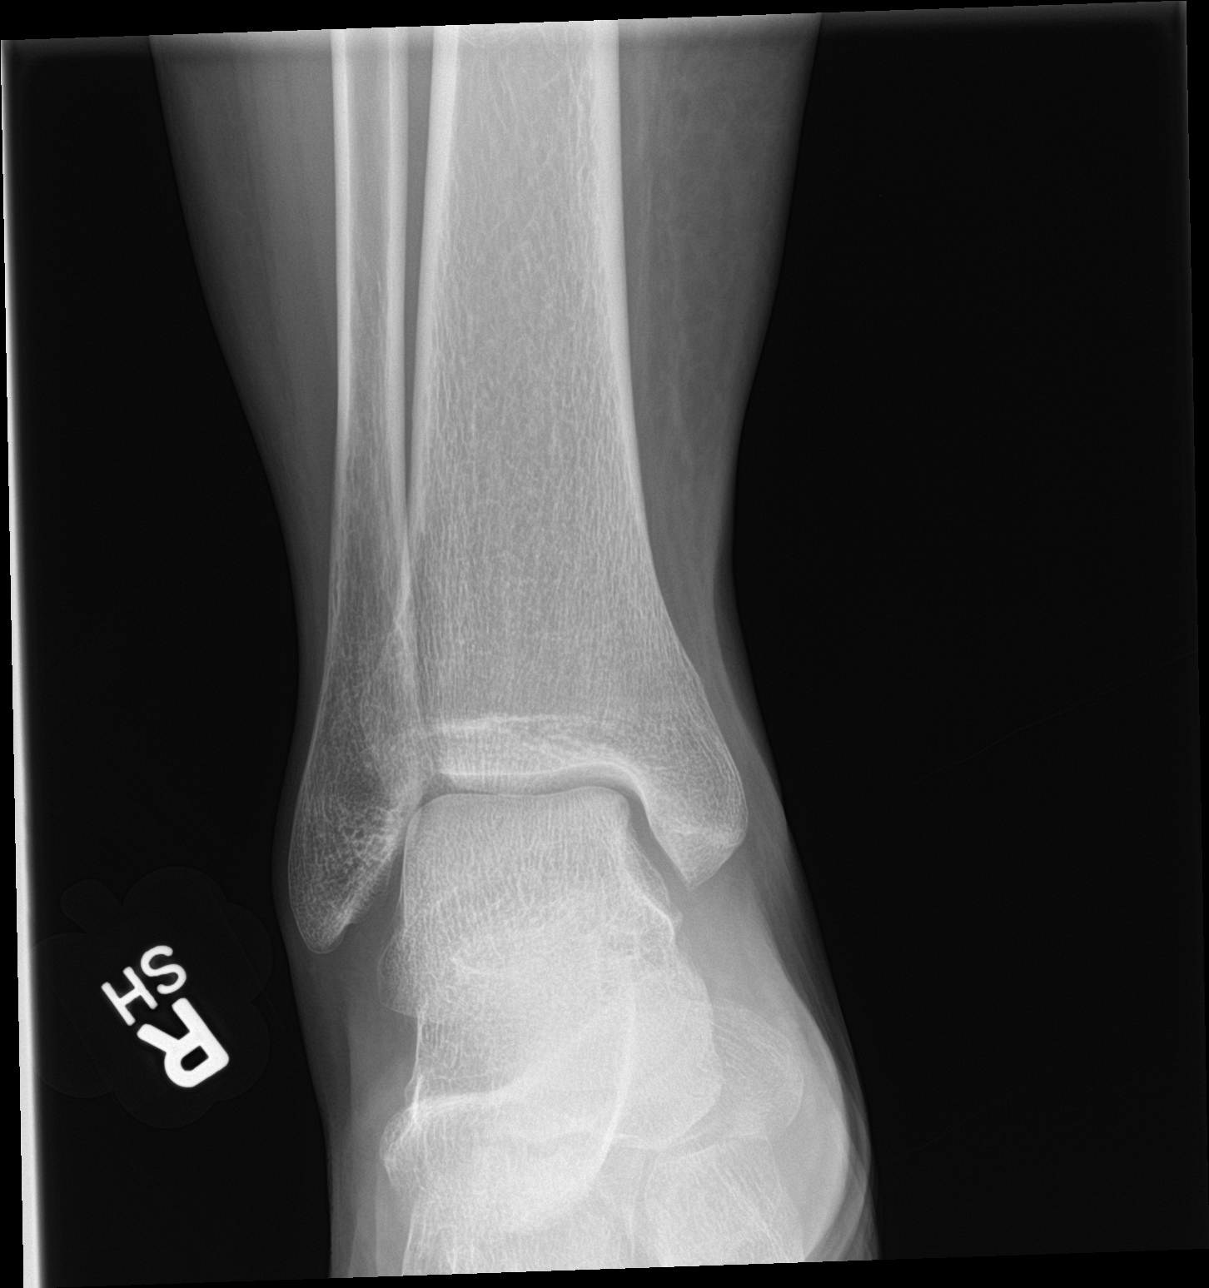

[ankle lat]
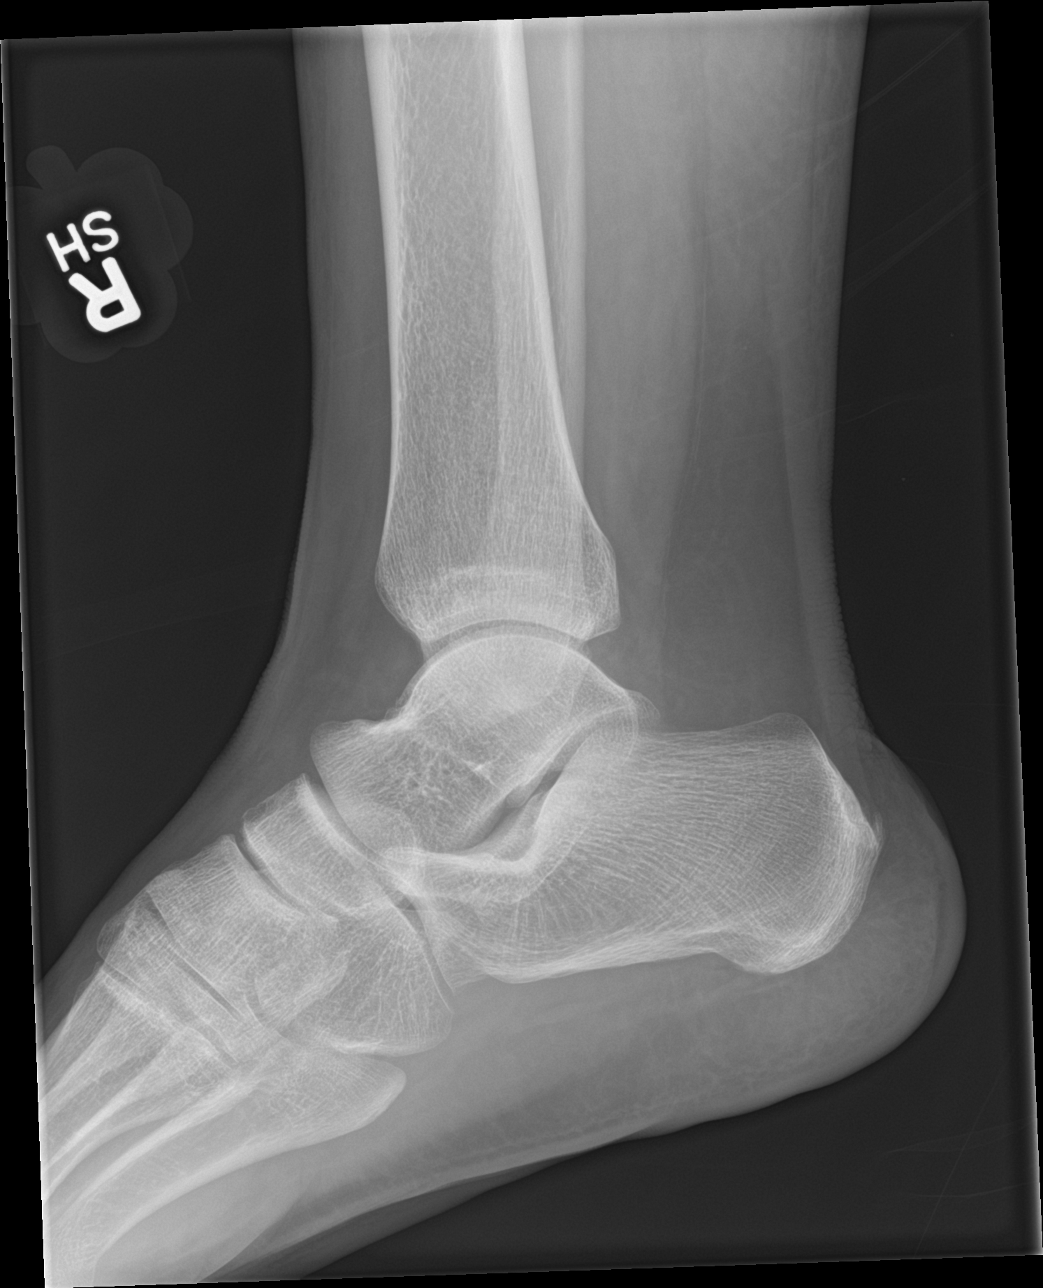

[3 of 3 positions shown; findings below may reference images not displayed]

FINDINGS: There is no evidence of fracture, dislocation, or joint effusion.
IMPRESSION: Negative.

## 2016-03-08 ENCOUNTER — Ambulatory Visit: Payer: Medicare Other | Admitting: Family Medicine

## 2016-04-10 ENCOUNTER — Emergency Department (HOSPITAL_BASED_OUTPATIENT_CLINIC_OR_DEPARTMENT_OTHER)
Admission: EM | Admit: 2016-04-10 | Discharge: 2016-04-10 | Disposition: A | Discharge status: Home / Self Care | Payer: Medicare Other | Attending: Emergency Medicine | Admitting: Emergency Medicine

## 2016-04-10 ENCOUNTER — Encounter (HOSPITAL_BASED_OUTPATIENT_CLINIC_OR_DEPARTMENT_OTHER): Payer: Self-pay | Admitting: *Deleted

## 2016-04-10 DIAGNOSIS — Z79899 Other long term (current) drug therapy: Secondary | ICD-10-CM | POA: Insufficient documentation

## 2016-04-10 DIAGNOSIS — J069 Acute upper respiratory infection, unspecified: Secondary | ICD-10-CM

## 2016-04-10 DIAGNOSIS — K5900 Constipation, unspecified: Secondary | ICD-10-CM | POA: Diagnosis not present

## 2016-04-10 DIAGNOSIS — R0981 Nasal congestion: Secondary | ICD-10-CM | POA: Diagnosis present

## 2016-04-10 MED ORDER — BENZONATATE 100 MG PO CAPS: 100.0000 mg | ORAL_CAPSULE | Freq: Three times a day (TID) | ORAL | 0 refills | Status: DC

## 2016-04-10 MED ORDER — ONDANSETRON 4 MG PO TBDP: 4.0000 mg | ORAL_TABLET | Freq: Three times a day (TID) | ORAL | 0 refills | Status: AC | PRN

## 2016-04-10 NOTE — Discharge Instructions (Signed)
Your symptoms are consistent with a viral illness. Viruses do not require antibiotics. Treatment is symptomatic care and it is important to note that these symptoms may last for 7-10 days. Drink plenty of fluids and get plenty of rest. You should be drinking at least half a liter of water an hour to stay hydrated. Electrolyte drinks are also encouraged. Ibuprofen, Naproxen, or Tylenol for pain or fever. Zofran for nausea. Tessalon for cough. Plain Mucinex may help relieve congestion. Warm liquids or Chloraseptic spray may help soothe a sore throat. Follow up with a primary care provider, as needed, for any future management of this issue.  You have been tested for the flu, at your request. These test results will not return for a few days.

## 2016-04-10 NOTE — ED Notes (Signed)
Patient walking around out to the waiting area and to her sons room  Unable to keep up with patient

## 2016-04-10 NOTE — ED Notes (Signed)
Discharge instructions for patient and 51 year old son reviewed with patient - voiced understanding.  Patient instructed to wear mask and was asking why.  Told her she was the one that wanted to be tested for the flu so she needed to wear a mask.
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
## 2016-04-10 NOTE — ED Triage Notes (Signed)
Pt here with son and mother who are also sick. States she has not noticed being sick, but has had a scratchy throat at night. Cough at triage. Congested and tearful.

## 2016-04-11 LAB — INFLUENZA PANEL BY PCR (TYPE A & B)
Influenza A By PCR: NEGATIVE
Influenza B By PCR: NEGATIVE

## 2017-05-15 ENCOUNTER — Emergency Department (HOSPITAL_BASED_OUTPATIENT_CLINIC_OR_DEPARTMENT_OTHER): Payer: Medicare Other

## 2017-05-15 ENCOUNTER — Other Ambulatory Visit: Payer: Self-pay

## 2017-05-15 ENCOUNTER — Emergency Department (HOSPITAL_BASED_OUTPATIENT_CLINIC_OR_DEPARTMENT_OTHER)
Admission: EM | Admit: 2017-05-15 | Discharge: 2017-05-15 | Discharge status: Against Medical Advice | Payer: Medicare Other | Attending: Emergency Medicine | Admitting: Emergency Medicine

## 2017-05-15 ENCOUNTER — Encounter (HOSPITAL_BASED_OUTPATIENT_CLINIC_OR_DEPARTMENT_OTHER): Payer: Self-pay | Admitting: *Deleted

## 2017-05-15 DIAGNOSIS — Y9301 Activity, walking, marching and hiking: Secondary | ICD-10-CM | POA: Insufficient documentation

## 2017-05-15 DIAGNOSIS — X58XXXA Exposure to other specified factors, initial encounter: Secondary | ICD-10-CM | POA: Insufficient documentation

## 2017-05-15 DIAGNOSIS — Y929 Unspecified place or not applicable: Secondary | ICD-10-CM | POA: Insufficient documentation

## 2017-05-15 DIAGNOSIS — Y999 Unspecified external cause status: Secondary | ICD-10-CM | POA: Insufficient documentation

## 2017-05-15 DIAGNOSIS — S99912A Unspecified injury of left ankle, initial encounter: Secondary | ICD-10-CM | POA: Insufficient documentation

## 2017-05-15 NOTE — ED Triage Notes (Signed)
Pt c/o left ankle injury twisted while walking x 10 mins ago

## 2017-05-15 NOTE — ED Notes (Signed)
Pt called to treatment room with no answer from lobby.  

## 2017-10-05 ENCOUNTER — Other Ambulatory Visit: Payer: Self-pay | Admitting: Family Medicine

## 2017-10-05 ENCOUNTER — Other Ambulatory Visit: Payer: Self-pay | Admitting: Psychiatry

## 2017-10-05 DIAGNOSIS — M545 Low back pain: Secondary | ICD-10-CM

## 2017-11-08 ENCOUNTER — Ambulatory Visit
Admission: RE | Admit: 2017-11-08 | Discharge: 2017-11-08 | Disposition: A | Discharge status: Home / Self Care | Payer: Medicare Other | Source: Ambulatory Visit | Attending: Family Medicine | Admitting: Family Medicine

## 2017-11-08 DIAGNOSIS — M545 Low back pain: Secondary | ICD-10-CM

## 2021-03-14 ENCOUNTER — Other Ambulatory Visit: Payer: Self-pay

## 2021-03-14 ENCOUNTER — Emergency Department (HOSPITAL_BASED_OUTPATIENT_CLINIC_OR_DEPARTMENT_OTHER): Payer: Medicare Other

## 2021-03-14 ENCOUNTER — Emergency Department (HOSPITAL_BASED_OUTPATIENT_CLINIC_OR_DEPARTMENT_OTHER)
Admission: EM | Admit: 2021-03-14 | Discharge: 2021-03-14 | Disposition: A | Discharge status: Home / Self Care | Payer: Medicare Other | Attending: Emergency Medicine | Admitting: Emergency Medicine

## 2021-03-14 ENCOUNTER — Encounter (HOSPITAL_BASED_OUTPATIENT_CLINIC_OR_DEPARTMENT_OTHER): Payer: Self-pay | Admitting: Emergency Medicine

## 2021-03-14 DIAGNOSIS — R0602 Shortness of breath: Secondary | ICD-10-CM | POA: Insufficient documentation

## 2021-03-14 DIAGNOSIS — J208 Acute bronchitis due to other specified organisms: Secondary | ICD-10-CM | POA: Diagnosis not present

## 2021-03-14 DIAGNOSIS — Z20822 Contact with and (suspected) exposure to covid-19: Secondary | ICD-10-CM | POA: Diagnosis not present

## 2021-03-14 DIAGNOSIS — R0789 Other chest pain: Secondary | ICD-10-CM | POA: Diagnosis present

## 2021-03-14 DIAGNOSIS — J040 Acute laryngitis: Secondary | ICD-10-CM | POA: Diagnosis not present

## 2021-03-14 LAB — CBC WITH DIFFERENTIAL/PLATELET
Abs Immature Granulocytes: 0.01 10*3/uL (ref 0.00–0.07)
Basophils Absolute: 0.1 10*3/uL (ref 0.0–0.1)
Basophils Relative: 1 %
Eosinophils Absolute: 0.3 10*3/uL (ref 0.0–0.5)
Eosinophils Relative: 5 %
HCT: 35.8 % — ABNORMAL LOW (ref 36.0–46.0)
Hemoglobin: 11.7 g/dL — ABNORMAL LOW (ref 12.0–15.0)
Immature Granulocytes: 0 %
Lymphocytes Relative: 47 %
Lymphs Abs: 3.1 10*3/uL (ref 0.7–4.0)
MCH: 25.6 pg — ABNORMAL LOW (ref 26.0–34.0)
MCHC: 32.7 g/dL (ref 30.0–36.0)
MCV: 78.3 fL — ABNORMAL LOW (ref 80.0–100.0)
Monocytes Absolute: 0.6 10*3/uL (ref 0.1–1.0)
Monocytes Relative: 9 %
Neutro Abs: 2.5 10*3/uL (ref 1.7–7.7)
Neutrophils Relative %: 38 %
Platelets: 299 10*3/uL (ref 150–400)
RBC: 4.57 MIL/uL (ref 3.87–5.11)
RDW: 15 % (ref 11.5–15.5)
WBC: 6.6 10*3/uL (ref 4.0–10.5)
nRBC: 0 % (ref 0.0–0.2)

## 2021-03-14 LAB — COMPREHENSIVE METABOLIC PANEL
ALT: 19 U/L (ref 0–44)
AST: 22 U/L (ref 15–41)
Albumin: 4.1 g/dL (ref 3.5–5.0)
Alkaline Phosphatase: 54 U/L (ref 38–126)
Anion gap: 10 (ref 5–15)
BUN: 8 mg/dL (ref 6–20)
CO2: 26 mmol/L (ref 22–32)
Calcium: 9 mg/dL (ref 8.9–10.3)
Chloride: 100 mmol/L (ref 98–111)
Creatinine, Ser: 0.78 mg/dL (ref 0.44–1.00)
GFR, Estimated: >60 mL/min (ref >60)
Glucose, Bld: 80 mg/dL (ref 70–99)
Potassium: 4.2 mmol/L (ref 3.5–5.1)
Sodium: 136 mmol/L (ref 135–145)
Total Bilirubin: 0.4 mg/dL (ref 0.3–1.2)
Total Protein: 7.5 g/dL (ref 6.5–8.1)

## 2021-03-14 LAB — TROPONIN I (HIGH SENSITIVITY)
Troponin I (High Sensitivity): <2 ng/L (ref <18)
Troponin I (High Sensitivity): <2 ng/L (ref <18)

## 2021-03-14 LAB — RESP PANEL BY RT-PCR (FLU A&B, COVID) ARPGX2
Influenza A by PCR: NEGATIVE
Influenza B by PCR: NEGATIVE
SARS Coronavirus 2 by RT PCR: NEGATIVE

## 2021-03-14 LAB — LACTIC ACID, PLASMA
Lactic Acid, Venous: 1.4 mmol/L (ref 0.5–1.9)
Lactic Acid, Venous: 0.7 mmol/L (ref 0.5–1.9)

## 2021-03-14 LAB — BRAIN NATRIURETIC PEPTIDE: B Natriuretic Peptide: 55.4 pg/mL (ref 0.0–100.0)

## 2021-03-14 MED ORDER — CYCLOBENZAPRINE HCL 10 MG PO TABS: 10.0000 mg | ORAL_TABLET | Freq: Two times a day (BID) | ORAL | 0 refills | Status: AC | PRN

## 2021-03-14 MED ORDER — IOHEXOL 350 MG/ML SOLN
100.0000 mL | Freq: Once | INTRAVENOUS | Status: AC | PRN
  Administered 2021-03-14: 21:00:00 100 mL via INTRAVENOUS

## 2021-03-14 MED ORDER — BENZONATATE 100 MG PO CAPS: 100.0000 mg | ORAL_CAPSULE | Freq: Three times a day (TID) | ORAL | 0 refills | Status: DC | PRN

## 2021-03-14 MED ORDER — DEXAMETHASONE SODIUM PHOSPHATE 10 MG/ML IJ SOLN
10.0000 mg | Freq: Once | INTRAMUSCULAR | Status: AC
  Administered 2021-03-14: 23:00:00 10 mg via INTRAVENOUS
  Filled 2021-03-14: qty 1

## 2021-03-14 NOTE — ED Notes (Signed)
RT assessed upon entering room. Stated she has had a cough for a few weeks. No histoy of breathing issues. BBS clear. SAT 100%

## 2021-03-14 NOTE — ED Notes (Signed)
Patient transported to CT 

## 2021-03-14 NOTE — ED Triage Notes (Signed)
Pt reports cough for the past week. Started having SOB last night. No hx of asthma or COPD to her knowledge. Pt speaking in short sentences.
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
## 2021-03-14 NOTE — ED Notes (Signed)
Patient independent with ambulation to bathroom.

## 2021-03-14 NOTE — ED Notes (Signed)
Patient discharged to home.  All discharge instructions reviewed.  Patient verbalized understanding via teachback method.  VS WDL.  Respirations even and unlabored.  Ambulatory out of ED.   °

## 2021-04-25 ENCOUNTER — Emergency Department (HOSPITAL_BASED_OUTPATIENT_CLINIC_OR_DEPARTMENT_OTHER)
Admission: EM | Admit: 2021-04-25 | Discharge: 2021-04-25 | Disposition: A | Discharge status: Home / Self Care | Payer: Medicare Other | Attending: Emergency Medicine | Admitting: Emergency Medicine

## 2021-04-25 ENCOUNTER — Other Ambulatory Visit: Payer: Self-pay

## 2021-04-25 ENCOUNTER — Emergency Department (HOSPITAL_BASED_OUTPATIENT_CLINIC_OR_DEPARTMENT_OTHER): Payer: Medicare Other

## 2021-04-25 ENCOUNTER — Encounter (HOSPITAL_BASED_OUTPATIENT_CLINIC_OR_DEPARTMENT_OTHER): Payer: Self-pay | Admitting: Emergency Medicine

## 2021-04-25 DIAGNOSIS — Z20822 Contact with and (suspected) exposure to covid-19: Secondary | ICD-10-CM | POA: Diagnosis not present

## 2021-04-25 DIAGNOSIS — R0789 Other chest pain: Secondary | ICD-10-CM | POA: Insufficient documentation

## 2021-04-25 DIAGNOSIS — R079 Chest pain, unspecified: Secondary | ICD-10-CM

## 2021-04-25 DIAGNOSIS — I10 Essential (primary) hypertension: Secondary | ICD-10-CM | POA: Diagnosis not present

## 2021-04-25 LAB — CBC
HCT: 37.3 % (ref 36.0–46.0)
Hemoglobin: 12.3 g/dL (ref 12.0–15.0)
MCH: 25.7 pg — ABNORMAL LOW (ref 26.0–34.0)
MCHC: 33 g/dL (ref 30.0–36.0)
MCV: 77.9 fL — ABNORMAL LOW (ref 80.0–100.0)
Platelets: 320 10*3/uL (ref 150–400)
RBC: 4.79 MIL/uL (ref 3.87–5.11)
RDW: 15.6 % — ABNORMAL HIGH (ref 11.5–15.5)
WBC: 7 10*3/uL (ref 4.0–10.5)
nRBC: 0 % (ref 0.0–0.2)

## 2021-04-25 LAB — BASIC METABOLIC PANEL
Anion gap: 9 (ref 5–15)
BUN: 9 mg/dL (ref 6–20)
CO2: 22 mmol/L (ref 22–32)
Calcium: 9.1 mg/dL (ref 8.9–10.3)
Chloride: 107 mmol/L (ref 98–111)
Creatinine, Ser: 0.8 mg/dL (ref 0.44–1.00)
GFR, Estimated: >60 mL/min (ref >60)
Glucose, Bld: 118 mg/dL — ABNORMAL HIGH (ref 70–99)
Potassium: 4.5 mmol/L (ref 3.5–5.1)
Sodium: 138 mmol/L (ref 135–145)

## 2021-04-25 LAB — RESP PANEL BY RT-PCR (FLU A&B, COVID) ARPGX2
Influenza A by PCR: NEGATIVE
Influenza B by PCR: NEGATIVE
SARS Coronavirus 2 by RT PCR: NEGATIVE

## 2021-04-25 LAB — TROPONIN I (HIGH SENSITIVITY)
Troponin I (High Sensitivity): <2 ng/L (ref <18)
Troponin I (High Sensitivity): <2 ng/L (ref <18)

## 2021-04-25 MED ORDER — DEXAMETHASONE 4 MG PO TABS
6.0000 mg | ORAL_TABLET | Freq: Once | ORAL | Status: AC
Start: 2021-04-25 — End: 2021-04-25
  Administered 2021-04-25: 6 mg via ORAL
  Filled 2021-04-25: qty 2
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
## 2021-04-25 NOTE — Discharge Instructions (Signed)
Your work-up today was reassuring, no signs of cardiac cause of your pain.  I suspect it is due to muscle wall inflammation.  You should follow-up with your primary care doctor next week if symptoms persist.  Have also provided information for cardiology, you can set up an appointment and follow-up with them for additional work-up as well.

## 2021-04-25 NOTE — ED Notes (Signed)
ED Provider at bedside. 

## 2021-04-25 NOTE — ED Triage Notes (Signed)
Pt arrives pov with right side rib pain starting last night, endorses painful to breath. Denies injury

## 2021-06-20 ENCOUNTER — Emergency Department (HOSPITAL_COMMUNITY)
Admission: EM | Admit: 2021-06-20 | Discharge: 2021-06-20 | Disposition: A | Discharge status: Home / Self Care | Payer: Medicare Other | Attending: Emergency Medicine | Admitting: Emergency Medicine

## 2021-06-20 ENCOUNTER — Encounter (HOSPITAL_COMMUNITY): Payer: Self-pay

## 2021-06-20 ENCOUNTER — Other Ambulatory Visit: Payer: Self-pay

## 2021-06-20 DIAGNOSIS — R509 Fever, unspecified: Secondary | ICD-10-CM | POA: Diagnosis present

## 2021-06-20 DIAGNOSIS — U071 COVID-19: Secondary | ICD-10-CM | POA: Insufficient documentation

## 2021-06-20 DIAGNOSIS — Z20822 Contact with and (suspected) exposure to covid-19: Secondary | ICD-10-CM

## 2021-06-20 LAB — RESP PANEL BY RT-PCR (FLU A&B, COVID) ARPGX2
Influenza A by PCR: NEGATIVE
Influenza B by PCR: NEGATIVE
SARS Coronavirus 2 by RT PCR: POSITIVE — AB

## 2021-06-20 MED ORDER — ACETAMINOPHEN 500 MG PO TABS
1000.0000 mg | ORAL_TABLET | Freq: Once | ORAL | Status: AC
  Administered 2021-06-20: 1000 mg via ORAL
  Filled 2021-06-20: qty 2

## 2021-06-20 MED ORDER — BENZONATATE 100 MG PO CAPS
200.0000 mg | ORAL_CAPSULE | Freq: Once | ORAL | Status: AC
  Administered 2021-06-20: 200 mg via ORAL
  Filled 2021-06-20: qty 2

## 2021-06-20 MED ORDER — BENZONATATE 200 MG PO CAPS: 200.0000 mg | ORAL_CAPSULE | Freq: Three times a day (TID) | ORAL | 0 refills | Status: AC | PRN

## 2021-06-20 MED ORDER — MOLNUPIRAVIR EUA 200MG CAPSULE: 4.0000 | ORAL_CAPSULE | Freq: Two times a day (BID) | ORAL | 0 refills | Status: AC

## 2021-06-20 MED ORDER — ALBUTEROL SULFATE HFA 108 (90 BASE) MCG/ACT IN AERS
2.0000 | INHALATION_SPRAY | Freq: Once | RESPIRATORY_TRACT | Status: AC
  Administered 2021-06-20: 2 via RESPIRATORY_TRACT
  Filled 2021-06-20: qty 6.7

## 2021-06-20 NOTE — ED Triage Notes (Signed)
Pt reports cough, sore throat, and fever that began last night. Pt reports her mother is currently admitted for COVID and she reports being around her recently.  ?

## 2021-06-20 NOTE — ED Provider Notes (Signed)
?[facility] DEPT ?Provider Note ? ? ?CSN: [id number] ?Arrival date & time: 06/20/21  1805 ? ?  ? ?History ? ?Chief Complaint  ?Patient presents with  ? Cough  ? Fever  ? ? ?Caitlin Stanley is a 56 y.o. female. ? ?Caitlin Stanley is a 56 y.o. female with a history of chronic back pain, reflux, uterine fibroids, who presents to the emergency department for possible COVID exposure.  Patient reports that she began feeling poorly last night with fever, cough, sore throat, headache and myalgias.  She reports that her mother became ill on Friday and yesterday was admitted with the hospital and she was told that her mom tested positive for COVID, she does not know of any other sick contacts.  She has not taken any medications for her symptoms prior to arrival.  Reports persistent cough that has been dry as well as some nasal congestion.  Reports some soreness from coughing but otherwise no persistent chest pain or shortness of breath.  No history of reactive airway disease or other lung problems, denies smoking history.  Reports receiving 2 doses of COVID-vaccine. ? ? ?Cough ?Associated symptoms: fever   ?Fever ?Associated symptoms: cough   ? ?  ? ?Home Medications ?Prior to Admission medications   ?Medication Sig Start Date End Date Taking? Authorizing Provider  ?benzonatate (TESSALON) 200 MG capsule Take 1 capsule (200 mg total) by mouth 3 (three) times daily as needed for cough. 06/20/21  Yes Jacqlyn Larsen, PA-C  ?molnupiravir EUA (LAGEVRIO) 200 mg CAPS capsule Take 4 capsules (800 mg total) by mouth 2 (two) times daily for 5 days. 06/20/21 06/25/21 Yes Jacqlyn Larsen, PA-C  ?busPIRone (BUSPAR) 15 MG tablet Take 15 mg by mouth 3 (three) times daily.    [provider]  ?cyclobenzaprine (FLEXERIL) 10 MG tablet Take 1 tablet (10 mg total) by mouth 2 (two) times daily as needed for muscle spasms. 03/14/21   Gareth Morgan, MD  ?diazepam (VALIUM) 5 MG tablet Take 5 mg by mouth 3 (three)  times daily.    [provider]  ?Esomeprazole Magnesium (NEXIUM PO) Take by mouth.    [provider]  ?ferrous sulfate 325 (65 FE) MG EC tablet Take 325 mg by mouth daily as needed. For low iron    [provider]  ?Flaxseed, Linseed, 1000 MG CAPS Take 1,000 mg by mouth 3 (three) times daily.    [provider]  ?FLUoxetine (PROZAC) 40 MG capsule Take 60 mg by mouth daily.     [provider]  ?misoprostol (CYTOTEC) 100 MCG tablet Take 100 mcg by mouth 4 (four) times daily.    [provider]  ?Multiple Vitamin (MULITIVITAMIN WITH MINERALS) TABS Take 1 tablet by mouth daily.    [provider]  ?ondansetron (ZOFRAN ODT) 4 MG disintegrating tablet '4mg'$  ODT q4 hours prn nausea/vomit 08/20/13   Julianne Rice, MD  ?ondansetron (ZOFRAN ODT) 4 MG disintegrating tablet Take 1 tablet (4 mg total) by mouth every 8 (eight) hours as needed for nausea or vomiting. 04/10/16   Joy, Shawn C, PA-C  ?oxycodone (OXY-IR) 5 MG capsule Take 5 mg by mouth 3 (three) times daily.    [provider]  ?oxyCODONE (OXYCONTIN) 40 MG 12 hr tablet Take 30 mg by mouth every 12 (twelve) hours.     [provider]  ?pantoprazole (PROTONIX) 40 MG tablet Take 40 mg by mouth daily.    [provider]  ?sucralfate (Horicon)  1 G tablet Take 1 tablet (1 g total) by mouth 3 (three) times daily as needed. 08/20/13   Julianne Rice, MD  ?triamcinolone cream (KENALOG) 0.1 % Apply topically 2 (two) times daily. 12/13/12   Palumbo, April, MD  ?Wheat Germ Oil CAPS Take 1 capsule by mouth 3 (three) times daily.    [provider]  ?   ? ?Allergies    ?Motrin [ibuprofen]   ? ?Review of Systems   ?Review of Systems  ?Constitutional:  Positive for fever.  ?Respiratory:  Positive for cough.   ? ?Physical Exam ?Updated Vital Signs ?BP (!) 149/102   Pulse 99   Temp (!) 101.2 ?F (38.4 ?C) (Oral)   Resp 18   SpO2 98%  ?Physical Exam ? ?ED Results / Procedures /  Treatments   ?Labs ?(all labs ordered are listed, but only abnormal results are displayed) ?Labs Reviewed  ?RESP PANEL BY RT-PCR (FLU A&B, COVID) ARPGX2 - Abnormal; Notable for the following components:  ?    Result Value  ? SARS Coronavirus 2 by RT PCR POSITIVE (*)   ? All other components within normal limits  ? ? ?EKG ?None ? ?Radiology ?No results found. ? ?Procedures ?Procedures  ? ? ?Medications Ordered in ED ?Medications  ?acetaminophen (TYLENOL) tablet 1,000 mg (1,000 mg Oral Given 06/20/21 1908)  ?benzonatate (TESSALON) capsule 200 mg (200 mg Oral Given 06/20/21 1908)  ?albuterol (VENTOLIN HFA) 108 (90 Base) MCG/ACT inhaler 2 puff (2 puffs Inhalation Given 06/20/21 1908)  ? ? ?ED Course/ Medical Decision Making/ A&P ?  ?                        ?Medical Decision Making ?Problems Addressed: ?Suspected COVID-19 virus infection: acute illness or injury with systemic symptoms ? ?Amount and/or Complexity of Data Reviewed ?External Data Reviewed: notes. ?Labs: ordered. ? ?Risk ?OTC drugs. ?Prescription drug management. ?Decision regarding hospitalization. ? ? ?56 y.o. female presents with 2 days of fever, cough, sore throat, myalgias. Concerned for possible COVID. Known COVID exposure. Patient is unvaccinated. Patient has received COVID vaccines. Fortunately patient is overall well-appearing, febrile but vitals otherwise reassuring. Patient with no hypoxia or increased work of breathing at rest or with activity.  ? ?Given reassuring O2 sats do not feel that chest x-ray is indicated at this time. ? ?Patient has a COVID and flu test pending. ? ?Patient treated here in the emergency department with albuterol, Tylenol and Tessalon with significant improvement in patient's symptoms. ? ?Patient with suspected COVID infection today but overall symptoms appear mild and evaluation has been very reassuring today. No criteria for admission at this time.  Given all other chronic illnesses discussed with patient will treat with  molnupiravir.  Discussed appropriate quarantine at home as well as continued symptomatic treatment. Encourage patient to purchase pulse ox for monitoring of O2 sats at home and discussed strict return precaution. Strict return precautions discussed. Patient expresses understanding and agreement. Discharged home in good condition. ? ?Caitlin Stanley was evaluated in Emergency Department on 06/20/2021 for the symptoms described in the history of present illness. She was evaluated in the context of the global COVID-19 pandemic, which necessitated consideration that the patient might be at risk for infection with the SARS-CoV-2 virus that causes COVID-19. Institutional protocols and algorithms that pertain to the evaluation of patients at risk for COVID-19 are in a state of rapid change based on information released by regulatory bodies including the CDC and federal  and state organizations. These policies and algorithms were followed during the patient's care in the ED. ? ? ? ? ? ? ? ? ?Final Clinical Impression(s) / ED Diagnoses ?Final diagnoses:  ?Suspected COVID-19 virus infection  ? ? ?Rx / DC Orders ?ED Discharge Orders   ? ?      Ordered  ?  molnupiravir EUA (LAGEVRIO) 200 mg CAPS capsule  2 times daily       ? 06/20/21 2013  ?  benzonatate (TESSALON) 200 MG capsule  3 times daily PRN       ? 06/20/21 2013  ? ?  ?  ? ?  ? ? ?  ?Jacqlyn Larsen, PA-C ?06/20/21 2222 ? ?  ?Malvin Johns, MD ?06/20/21 2227 ? ?

## 2021-06-20 NOTE — Discharge Instructions (Addendum)
Your COVID testing is pending and you will be called with positive results but given your close recent exposure I have high suspicion that you do have COVID.  You should quarantine at home, and you should not be a visitor in the hospital and try to avoid going out in public.  If you have other of close family members in your house that developed similar symptoms I would assume that they have Reubens as well. ? ?To treat your symptoms supportively you can take Tylenol 1000 mg every 6 hours you can also use prescribed Tessalon Perles 3 times daily as needed and you can use albuterol inhaler 2 puffs every 4-6 hours as needed.  You can also use over-the-counter cough drops, and you can use Cepacol throat lozenges to help with sore throat. ? ?You can take prescribed molnupiravir to help keep your COVID infection from worsening. ? ?Follow-up with your primary care doctor.  Return to the hospital if you have increased shortness of breath or difficulty breathing or other new or concerning symptoms. ?

## 2021-07-08 ENCOUNTER — Encounter (HOSPITAL_BASED_OUTPATIENT_CLINIC_OR_DEPARTMENT_OTHER): Payer: Self-pay

## 2021-07-08 ENCOUNTER — Other Ambulatory Visit: Payer: Self-pay

## 2021-07-08 ENCOUNTER — Emergency Department (HOSPITAL_BASED_OUTPATIENT_CLINIC_OR_DEPARTMENT_OTHER): Payer: Medicare Other

## 2021-07-08 ENCOUNTER — Emergency Department (HOSPITAL_BASED_OUTPATIENT_CLINIC_OR_DEPARTMENT_OTHER)
Admission: EM | Admit: 2021-07-08 | Discharge: 2021-07-08 | Disposition: A | Discharge status: Home / Self Care | Payer: Medicare Other | Attending: Emergency Medicine | Admitting: Emergency Medicine

## 2021-07-08 DIAGNOSIS — Z8616 Personal history of COVID-19: Secondary | ICD-10-CM | POA: Insufficient documentation

## 2021-07-08 DIAGNOSIS — J069 Acute upper respiratory infection, unspecified: Secondary | ICD-10-CM | POA: Insufficient documentation

## 2021-07-08 DIAGNOSIS — J040 Acute laryngitis: Secondary | ICD-10-CM | POA: Diagnosis not present

## 2021-07-08 DIAGNOSIS — R000 Tachycardia, unspecified: Secondary | ICD-10-CM | POA: Diagnosis not present

## 2021-07-08 DIAGNOSIS — R059 Cough, unspecified: Secondary | ICD-10-CM | POA: Diagnosis present

## 2021-07-08 MED ORDER — BENZONATATE 100 MG PO CAPS: 100.0000 mg | ORAL_CAPSULE | Freq: Once | ORAL | Status: AC

## 2021-07-08 MED ORDER — DOXYCYCLINE HYCLATE 100 MG PO CAPS: 100.0000 mg | ORAL_CAPSULE | Freq: Two times a day (BID) | ORAL | 0 refills | Status: AC

## 2021-07-08 MED ORDER — DEXAMETHASONE SODIUM PHOSPHATE 10 MG/ML IJ SOLN
10.0000 mg | Freq: Once | INTRAMUSCULAR | Status: AC
  Administered 2021-07-08: 10 mg via INTRAMUSCULAR

## 2021-07-08 MED ORDER — BENZONATATE 100 MG PO CAPS
ORAL_CAPSULE | ORAL | Status: AC
  Administered 2021-07-08: 100 mg via ORAL
  Filled 2021-07-08: qty 1

## 2021-07-08 MED ORDER — DOXYCYCLINE HYCLATE 100 MG PO TABS: 100.0000 mg | ORAL_TABLET | Freq: Once | ORAL | Status: AC

## 2021-07-08 MED ORDER — BENZONATATE 100 MG PO CAPS: 100.0000 mg | ORAL_CAPSULE | Freq: Two times a day (BID) | ORAL | 0 refills | Status: AC

## 2021-07-08 MED ORDER — DOXYCYCLINE HYCLATE 100 MG PO TABS
ORAL_TABLET | ORAL | Status: AC
  Administered 2021-07-08: 100 mg via ORAL
  Filled 2021-07-08: qty 1

## 2021-07-08 MED ORDER — DEXAMETHASONE SODIUM PHOSPHATE 10 MG/ML IJ SOLN
INTRAMUSCULAR | Status: AC
  Filled 2021-07-08: qty 1

## 2021-07-08 NOTE — ED Provider Notes (Signed)
?[facility] EMERGENCY DEPARTMENT ?Provider Note ? ? ?CSN: [id number] ?Arrival date & time: 07/08/21  1100 ? ?  ? ?History ? ?Chief Complaint  ?Patient presents with  ? Cough  ? ? ?Caitlin Stanley is a 56 y.o. female present emergency department cough, congestion, fever, shortness of breath.  Patient was diagnosed with COVID on 3/12 in the ER.  She felt she was recovering after COVID but then a week ago began having worsening cough, congestion, sore throat, loss of voice, muscle aches and joint pain.  Her son is also here in the ER and is also had similar symptoms. ? ?HPI ? ?  ? ?Home Medications ?Prior to Admission medications   ?Medication Sig Start Date End Date Taking? Authorizing Provider  ?benzonatate (TESSALON) 100 MG capsule Take 1 capsule (100 mg total) by mouth 2 (two) times daily for 20 doses. 07/08/21 07/18/21 Yes Saulo Anthis, Carola Rhine, MD  ?doxycycline (VIBRAMYCIN) 100 MG capsule Take 1 capsule (100 mg total) by mouth 2 (two) times daily for 7 days. 07/08/21 07/15/21 Yes Petra Sargeant, Carola Rhine, MD  ?benzonatate (TESSALON) 200 MG capsule Take 1 capsule (200 mg total) by mouth 3 (three) times daily as needed for cough. 06/20/21   Jacqlyn Larsen, PA-C  ?busPIRone (BUSPAR) 15 MG tablet Take 15 mg by mouth 3 (three) times daily.    [provider]  ?cyclobenzaprine (FLEXERIL) 10 MG tablet Take 1 tablet (10 mg total) by mouth 2 (two) times daily as needed for muscle spasms. 03/14/21   Gareth Morgan, MD  ?diazepam (VALIUM) 5 MG tablet Take 5 mg by mouth 3 (three) times daily.    [provider]  ?Esomeprazole Magnesium (NEXIUM PO) Take by mouth.    [provider]  ?ferrous sulfate 325 (65 FE) MG EC tablet Take 325 mg by mouth daily as needed. For low iron    [provider]  ?Flaxseed, Linseed, 1000 MG CAPS Take 1,000 mg by mouth 3 (three) times daily.    [provider]  ?FLUoxetine (PROZAC) 40 MG capsule Take 60 mg by mouth daily.     [provider]   ?misoprostol (CYTOTEC) 100 MCG tablet Take 100 mcg by mouth 4 (four) times daily.    [provider]  ?Multiple Vitamin (MULITIVITAMIN WITH MINERALS) TABS Take 1 tablet by mouth daily.    [provider]  ?ondansetron (ZOFRAN ODT) 4 MG disintegrating tablet '4mg'$  ODT q4 hours prn nausea/vomit 08/20/13   Julianne Rice, MD  ?ondansetron (ZOFRAN ODT) 4 MG disintegrating tablet Take 1 tablet (4 mg total) by mouth every 8 (eight) hours as needed for nausea or vomiting. 04/10/16   Joy, Shawn C, PA-C  ?oxycodone (OXY-IR) 5 MG capsule Take 5 mg by mouth 3 (three) times daily.    [provider]  ?oxyCODONE (OXYCONTIN) 40 MG 12 hr tablet Take 30 mg by mouth every 12 (twelve) hours.     [provider]  ?pantoprazole (PROTONIX) 40 MG tablet Take 40 mg by mouth daily.    [provider]  ?sucralfate (CARAFATE) 1 G tablet Take 1 tablet (1 g total) by mouth 3 (three) times daily as needed. 08/20/13   Julianne Rice, MD  ?triamcinolone cream (KENALOG) 0.1 % Apply topically 2 (two) times daily. 12/13/12   Palumbo, April, MD  ?Wheat Germ Oil CAPS Take 1 capsule by mouth 3 (three) times daily.    [provider]  ?   ? ?Allergies    ?Motrin [ibuprofen]   ? ?  Review of Systems   ?Review of Systems ? ?Physical Exam ?Updated Vital Signs ?BP (!) 147/83 (BP Location: Left Arm)   Pulse (!) 107   Temp 98.4 ?F (36.9 ?C) (Oral)   Resp 20   Ht '5\' 9"'$  (1.753 m)   Wt 113.4 kg   SpO2 98%   BMI 36.92 kg/m?  ?Physical Exam ?Constitutional:   ?   General: She is not in acute distress. ?   Comments: Hoarse voice, dry cough  ?HENT:  ?   Head: Normocephalic and atraumatic.  ?Eyes:  ?   Conjunctiva/sclera: Conjunctivae normal.  ?   Pupils: Pupils are equal, round, and reactive to light.  ?Cardiovascular:  ?   Rate and Rhythm: Regular rhythm. Tachycardia present.  ?   Pulses: Normal pulses.  ?Pulmonary:  ?   Effort: Pulmonary effort is normal. No respiratory distress.  ?Abdominal:  ?   General:  There is no distension.  ?   Tenderness: There is no abdominal tenderness.  ?Skin: ?   General: Skin is warm and dry.  ?Neurological:  ?   General: No focal deficit present.  ?   Mental Status: She is alert. Mental status is at baseline.  ?Psychiatric:     ?   Mood and Affect: Mood normal.     ?   Behavior: Behavior normal.  ? ? ?ED Results / Procedures / Treatments   ?Labs ?(all labs ordered are listed, but only abnormal results are displayed) ?Labs Reviewed - No data to display ? ?EKG ?None ? ?Radiology ?DG Chest 2 View ? ?Result Date: 07/08/2021 ?CLINICAL DATA:  Cough and short of breath.  Fever chills EXAM: CHEST - 2 VIEW COMPARISON:  04/25/2021 FINDINGS: The heart size and mediastinal contours are within normal limits. Both lungs are clear. The visualized skeletal structures are unremarkable. IMPRESSION: No active cardiopulmonary disease. Electronically Signed   By: Franchot Gallo M.D.   On: 07/08/2021 11:25   ? ?Procedures ?Procedures  ? ? ?Medications Ordered in ED ?Medications  ?doxycycline (VIBRA-TABS) tablet 100 mg (100 mg Oral Given 07/08/21 1146)  ?benzonatate (TESSALON) capsule 100 mg (100 mg Oral Given 07/08/21 1146)  ?dexamethasone (DECADRON) injection 10 mg ( Intramuscular Canceled Entry 07/08/21 1150)  ? ? ?ED Course/ Medical Decision Making/ A&P ?  ?                        ?Medical Decision Making ?Amount and/or Complexity of Data Reviewed ?Radiology: ordered. ? ?Risk ?Prescription drug management. ? ? ?This patient presents to the ED with concern for cough, laryngitis. This involves an extensive number of treatment options, and is a complaint that carries with it a high risk of complications and morbidity.  The differential diagnosis includes recurring or new viral infection for superimposed bacterial infection versus other ? ?Patient has no hypoxia on arrival, no new oxygen requirement. ?Low suspicion for PE based on this clinical presentation ? ?I ordered imaging studies including x-ray of the  chest ?I independently visualized and interpreted imaging which showed no focal infiltrates ?I agree with the radiologist interpretation ? ?IM steroids ordered for general joint pain, malaise, headache, pain control ?Tessalon for cough ? ?Test Considered:  ? - Doubt deep space neck infection, acute PE, do not feel that the CT images are warranted at this time ? ? ?After the interventions noted above, I reevaluated the patient and found that they have: stayed the same ? ? ?Dispostion: ? ?After consideration of the  diagnostic results and the patients response to treatment, I feel that the patent would benefit from outpatient PCP follow-up.  We discussed home care management for her symptoms. ? ? ? ? ? ? ? ? ?Final Clinical Impression(s) / ED Diagnoses ?Final diagnoses:  ?Viral URI with cough  ?Laryngitis  ? ? ?Rx / DC Orders ?ED Discharge Orders   ? ?      Ordered  ?  benzonatate (TESSALON) 100 MG capsule  2 times daily       ? 07/08/21 1204  ?  doxycycline (VIBRAMYCIN) 100 MG capsule  2 times daily       ? 07/08/21 1204  ? ?  ?  ? ?  ? ? ?  ?Wyvonnia Dusky, MD ?07/08/21 1205 ? ?

## 2021-07-08 NOTE — Discharge Instructions (Addendum)
Pick up Vick's Vaporub at the pharmacy. ?

## 2021-07-08 NOTE — ED Triage Notes (Signed)
Pt c/o cough, fever, chills x 4 weeks. Positive for COVID on 3/12. States cough has gotten worse and has shortness of breath.  ?

## 2021-12-23 ENCOUNTER — Other Ambulatory Visit: Payer: Self-pay | Admitting: Nurse Practitioner

## 2021-12-23 DIAGNOSIS — M5417 Radiculopathy, lumbosacral region: Secondary | ICD-10-CM

## 2022-06-28 ENCOUNTER — Other Ambulatory Visit: Payer: Self-pay

## 2022-06-28 ENCOUNTER — Emergency Department (HOSPITAL_BASED_OUTPATIENT_CLINIC_OR_DEPARTMENT_OTHER): Payer: Medicare Other

## 2022-06-28 ENCOUNTER — Emergency Department (HOSPITAL_BASED_OUTPATIENT_CLINIC_OR_DEPARTMENT_OTHER)
Admission: EM | Admit: 2022-06-28 | Discharge: 2022-06-28 | Disposition: A | Discharge status: Home / Self Care | Payer: Medicare Other | Attending: Emergency Medicine | Admitting: Emergency Medicine

## 2022-06-28 DIAGNOSIS — R059 Cough, unspecified: Secondary | ICD-10-CM | POA: Diagnosis present

## 2022-06-28 DIAGNOSIS — Z1152 Encounter for screening for COVID-19: Secondary | ICD-10-CM | POA: Diagnosis not present

## 2022-06-28 DIAGNOSIS — J069 Acute upper respiratory infection, unspecified: Secondary | ICD-10-CM | POA: Insufficient documentation

## 2022-06-28 LAB — RESP PANEL BY RT-PCR (RSV, FLU A&B, COVID)  RVPGX2
Influenza A by PCR: NEGATIVE
Influenza B by PCR: NEGATIVE
Resp Syncytial Virus by PCR: NEGATIVE
SARS Coronavirus 2 by RT PCR: NEGATIVE

## 2022-06-28 MED ORDER — HYDROCODONE BIT-HOMATROP MBR 5-1.5 MG/5ML PO SOLN: 5.0000 mL | Freq: Four times a day (QID) | ORAL | 0 refills | Status: AC | PRN

## 2022-06-28 MED ORDER — PREDNISONE 20 MG PO TABS: 60.0000 mg | ORAL_TABLET | Freq: Every day | ORAL | 0 refills | Status: AC

## 2022-06-28 NOTE — ED Triage Notes (Signed)
Pt reports shortness of breath and cough since Sunday. Pt has hoarse voice and dry, cough. Pt denies hx of lung issues or asthma.

## 2022-06-28 NOTE — ED Notes (Signed)
Discharge paperwork reviewed entirely with patient, including Rx's and follow up care. Pain was under control. Pt verbalized understanding as well as all parties involved. No questions or concerns voiced at the time of discharge. No acute distress noted.   Pt ambulated out to PVA without incident or assistance.  

## 2022-06-28 NOTE — Discharge Instructions (Signed)
Your respiratory panel was negative for COVID, flu, RSV.  However this is likely another viral URI.  I have sent in a cough syrup for you to help with your severe coughing spells.  Have sent some steroids in for you to help with the voice loss/laryngitis.  If any concerning symptoms return to the emergency room otherwise please follow-up with your primary care provider.

## 2022-06-28 NOTE — ED Provider Notes (Signed)
[facility] Provider Note   CSN: [id number] Arrival date & time: 06/28/22  2124     History  Chief Complaint  Patient presents with   Cough   Shortness of Breath    Caitlin Stanley is a 57 y.o. female.  57 year old female presents today for evaluation of cough, sore throat, shortness of breath ongoing for the past couple days.  Endorses body aches, chills.  No definite fever.  Denies chest pain.  No prior history of heart disease.  Denies any history of heart disease.  The history is provided by the patient. No language interpreter was used.       Home Medications Prior to Admission medications   Medication Sig Start Date End Date Taking? Authorizing Provider  benzonatate (TESSALON) 200 MG capsule Take 1 capsule (200 mg total) by mouth 3 (three) times daily as needed for cough. 06/20/21   Jacqlyn Larsen, PA-C  busPIRone (BUSPAR) 15 MG tablet Take 15 mg by mouth 3 (three) times daily.    [provider]  cyclobenzaprine (FLEXERIL) 10 MG tablet Take 1 tablet (10 mg total) by mouth 2 (two) times daily as needed for muscle spasms. 03/14/21   Gareth Morgan, MD  diazepam (VALIUM) 5 MG tablet Take 5 mg by mouth 3 (three) times daily.    [provider]  Esomeprazole Magnesium (NEXIUM PO) Take by mouth.    [provider]  ferrous sulfate 325 (65 FE) MG EC tablet Take 325 mg by mouth daily as needed. For low iron    [provider]  Flaxseed, Linseed, 1000 MG CAPS Take 1,000 mg by mouth 3 (three) times daily.    [provider]  FLUoxetine (PROZAC) 40 MG capsule Take 60 mg by mouth daily.     [provider]  misoprostol (CYTOTEC) 100 MCG tablet Take 100 mcg by mouth 4 (four) times daily.    [provider]  Multiple Vitamin (MULITIVITAMIN WITH MINERALS) TABS Take 1 tablet by mouth daily.    [provider]  ondansetron (ZOFRAN ODT) 4 MG disintegrating tablet 4mg  ODT  q4 hours prn nausea/vomit 08/20/13   Julianne Rice, MD  ondansetron (ZOFRAN ODT) 4 MG disintegrating tablet Take 1 tablet (4 mg total) by mouth every 8 (eight) hours as needed for nausea or vomiting. 04/10/16   Joy, Shawn C, PA-C  oxycodone (OXY-IR) 5 MG capsule Take 5 mg by mouth 3 (three) times daily.    [provider]  oxyCODONE (OXYCONTIN) 40 MG 12 hr tablet Take 30 mg by mouth every 12 (twelve) hours.     [provider]  pantoprazole (PROTONIX) 40 MG tablet Take 40 mg by mouth daily.    [provider]  sucralfate (CARAFATE) 1 G tablet Take 1 tablet (1 g total) by mouth 3 (three) times daily as needed. 08/20/13   Julianne Rice, MD  triamcinolone cream (KENALOG) 0.1 % Apply topically 2 (two) times daily. 12/13/12   Palumbo, April, MD  Wheat Germ Oil CAPS Take 1 capsule by mouth 3 (three) times daily.    [provider]      Allergies    Motrin [ibuprofen]    Review of Systems   Review of Systems  Constitutional:  Negative for chills and fever.  HENT:  Positive for sore throat and voice change. Negative for trouble swallowing.   Respiratory:  Positive for cough and shortness of breath.   Cardiovascular:  Negative for chest pain, palpitations  and leg swelling.  Gastrointestinal:  Negative for nausea.  Neurological:  Negative for light-headedness.  All other systems reviewed and are negative.   Physical Exam Updated Vital Signs BP (!) 126/93   Pulse 88   Temp 99.3 F (37.4 C) (Oral)   Resp (!) 22   Ht 5\' 9"  (1.753 m)   Wt 108.9 kg   SpO2 96%   BMI 35.44 kg/m  Physical Exam Vitals and nursing note reviewed.  Constitutional:      General: She is not in acute distress.    Appearance: Normal appearance. She is not ill-appearing.  HENT:     Head: Normocephalic and atraumatic.     Nose: Nose normal.  Eyes:     General: No scleral icterus.    Extraocular Movements: Extraocular movements intact.     Conjunctiva/sclera: Conjunctivae  normal.  Cardiovascular:     Rate and Rhythm: Normal rate and regular rhythm.     Pulses: Normal pulses.  Pulmonary:     Effort: Pulmonary effort is normal. No respiratory distress.     Breath sounds: Normal breath sounds. No wheezing or rales.  Abdominal:     General: There is no distension.     Palpations: Abdomen is soft.     Tenderness: There is no abdominal tenderness. There is no guarding.  Musculoskeletal:        General: Normal range of motion.     Cervical back: Normal range of motion.  Skin:    General: Skin is warm and dry.  Neurological:     General: No focal deficit present.     Mental Status: She is alert. Mental status is at baseline.     ED Results / Procedures / Treatments   Labs (all labs ordered are listed, but only abnormal results are displayed) Labs Reviewed  RESP PANEL BY RT-PCR (RSV, FLU A&B, COVID)  RVPGX2    EKG None  Radiology No results found.  Procedures Procedures    Medications Ordered in ED Medications - No data to display  ED Course/ Medical Decision Making/ A&P                             Medical Decision Making Amount and/or Complexity of Data Reviewed Radiology: ordered.  Risk Prescription drug management.   57 year old female presents today for evaluation of URI symptoms, and laryngitis.  Respiratory panel pending.  Will obtain chest x-ray.  Chest x-ray without evidence of acute cardiopulmonary process.  Respiratory panel negative.  Likely viral URI.  Will provide Hycodan for symptom management.  Will give prednisone for the laryngitis.  Patient is appropriate for discharge.  She is in agreement with plan.   Final Clinical Impression(s) / ED Diagnoses Final diagnoses:  Viral URI with cough    Rx / DC Orders ED Discharge Orders          Ordered    HYDROcodone bit-homatropine (HYCODAN) 5-1.5 MG/5ML syrup  Every 6 hours PRN        06/28/22 2259    predniSONE (DELTASONE) 20 MG tablet  Daily with breakfast         06/28/22 2259              Evlyn Courier, PA-C 06/28/22 2344    Gareth Morgan, MD 06/29/22 1429
